# Patient Record
Sex: Male | Born: 1935 | Race: White | Hispanic: No | Smoking: Former smoker
Health system: Southern US, Community
[De-identification: ages and names within clinical notes are randomized; demographics above are authoritative.]

## PROBLEM LIST (undated history)

## (undated) DIAGNOSIS — J449 Chronic obstructive pulmonary disease, unspecified: Secondary | ICD-10-CM

## (undated) DIAGNOSIS — K219 Gastro-esophageal reflux disease without esophagitis: Secondary | ICD-10-CM

## (undated) DIAGNOSIS — Z87440 Personal history of urinary (tract) infections: Secondary | ICD-10-CM

## (undated) DIAGNOSIS — N189 Chronic kidney disease, unspecified: Secondary | ICD-10-CM

## (undated) DIAGNOSIS — R0602 Shortness of breath: Secondary | ICD-10-CM

## (undated) DIAGNOSIS — K589 Irritable bowel syndrome without diarrhea: Secondary | ICD-10-CM

## (undated) DIAGNOSIS — I10 Essential (primary) hypertension: Secondary | ICD-10-CM

## (undated) DIAGNOSIS — H3530 Unspecified macular degeneration: Secondary | ICD-10-CM

## (undated) DIAGNOSIS — D649 Anemia, unspecified: Secondary | ICD-10-CM

## (undated) DIAGNOSIS — G629 Polyneuropathy, unspecified: Secondary | ICD-10-CM

## (undated) DIAGNOSIS — K5792 Diverticulitis of intestine, part unspecified, without perforation or abscess without bleeding: Secondary | ICD-10-CM

## (undated) DIAGNOSIS — M1990 Unspecified osteoarthritis, unspecified site: Secondary | ICD-10-CM

## (undated) DIAGNOSIS — F32A Depression, unspecified: Secondary | ICD-10-CM

## (undated) DIAGNOSIS — F329 Major depressive disorder, single episode, unspecified: Secondary | ICD-10-CM

## (undated) DIAGNOSIS — N400 Enlarged prostate without lower urinary tract symptoms: Secondary | ICD-10-CM

## (undated) DIAGNOSIS — N289 Disorder of kidney and ureter, unspecified: Secondary | ICD-10-CM

## (undated) DIAGNOSIS — J189 Pneumonia, unspecified organism: Secondary | ICD-10-CM

## (undated) HISTORY — PX: HERNIA REPAIR: SHX51

## (undated) HISTORY — PX: EYE SURGERY: SHX253

## (undated) HISTORY — PX: COLONOSCOPY: SHX174

## (undated) HISTORY — PX: CHOLECYSTECTOMY: SHX55

---

## 2009-04-06 DIAGNOSIS — J189 Pneumonia, unspecified organism: Secondary | ICD-10-CM

## 2009-04-06 HISTORY — DX: Pneumonia, unspecified organism: J18.9

## 2013-06-05 ENCOUNTER — Ambulatory Visit (HOSPITAL_COMMUNITY)
Admission: RE | Admit: 2013-06-05 | Discharge: 2013-06-05 | Disposition: A | Discharge status: Home / Self Care | Payer: Medicare Other | Source: Ambulatory Visit | Attending: Anesthesiology | Admitting: Anesthesiology

## 2013-06-05 ENCOUNTER — Other Ambulatory Visit (HOSPITAL_COMMUNITY): Payer: Self-pay | Admitting: *Deleted

## 2013-06-05 ENCOUNTER — Encounter (HOSPITAL_COMMUNITY): Payer: Self-pay

## 2013-06-05 ENCOUNTER — Other Ambulatory Visit: Payer: Self-pay | Admitting: Orthopedic Surgery

## 2013-06-05 ENCOUNTER — Encounter (HOSPITAL_COMMUNITY)
Admission: RE | Admit: 2013-06-05 | Discharge: 2013-06-05 | Disposition: A | Discharge status: Home / Self Care | Payer: Medicare Other | Source: Ambulatory Visit | Attending: Orthopedic Surgery | Admitting: Orthopedic Surgery

## 2013-06-05 DIAGNOSIS — Z01812 Encounter for preprocedural laboratory examination: Secondary | ICD-10-CM

## 2013-06-05 DIAGNOSIS — Z01818 Encounter for other preprocedural examination: Secondary | ICD-10-CM | POA: Insufficient documentation

## 2013-06-05 HISTORY — DX: Unspecified osteoarthritis, unspecified site: M19.90

## 2013-06-05 HISTORY — DX: Polyneuropathy, unspecified: G62.9

## 2013-06-05 HISTORY — DX: Essential (primary) hypertension: I10

## 2013-06-05 HISTORY — DX: Irritable bowel syndrome, unspecified: K58.9

## 2013-06-05 HISTORY — DX: Depression, unspecified: F32.A

## 2013-06-05 HISTORY — DX: Unspecified macular degeneration: H35.30

## 2013-06-05 HISTORY — DX: Anemia, unspecified: D64.9

## 2013-06-05 HISTORY — DX: Shortness of breath: R06.02

## 2013-06-05 HISTORY — DX: Gastro-esophageal reflux disease without esophagitis: K21.9

## 2013-06-05 HISTORY — DX: Pneumonia, unspecified organism: J18.9

## 2013-06-05 HISTORY — DX: Personal history of urinary (tract) infections: Z87.440

## 2013-06-05 HISTORY — DX: Chronic obstructive pulmonary disease, unspecified: J44.9

## 2013-06-05 HISTORY — DX: Benign prostatic hyperplasia without lower urinary tract symptoms: N40.0

## 2013-06-05 HISTORY — DX: Diverticulitis of intestine, part unspecified, without perforation or abscess without bleeding: K57.92

## 2013-06-05 HISTORY — DX: Irritable bowel syndrome without diarrhea: K58.9

## 2013-06-05 HISTORY — DX: Major depressive disorder, single episode, unspecified: F32.9

## 2013-06-05 LAB — CBC
HCT: 33.2 % — ABNORMAL LOW (ref 39.0–52.0)
Hemoglobin: 11.4 g/dL — ABNORMAL LOW (ref 13.0–17.0)
MCH: 31.6 pg (ref 26.0–34.0)
MCHC: 34.3 g/dL (ref 30.0–36.0)
MCV: 92 fL (ref 78.0–100.0)
PLATELETS: 198 10*3/uL (ref 150–400)
RBC: 3.61 MIL/uL — ABNORMAL LOW (ref 4.22–5.81)
RDW: 13.2 % (ref 11.5–15.5)
WBC: 4.4 10*3/uL (ref 4.0–10.5)

## 2013-06-05 LAB — COMPREHENSIVE METABOLIC PANEL
ALK PHOS: 46 U/L (ref 39–117)
ALT: 12 U/L (ref 0–53)
AST: 18 U/L (ref 0–37)
Albumin: 3.2 g/dL — ABNORMAL LOW (ref 3.5–5.2)
BUN: 53 mg/dL — AB (ref 6–23)
CO2: 25 meq/L (ref 19–32)
Calcium: 8.8 mg/dL (ref 8.4–10.5)
Chloride: 100 mEq/L (ref 96–112)
Creatinine, Ser: 2.56 mg/dL — ABNORMAL HIGH (ref 0.50–1.35)
GFR, EST AFRICAN AMERICAN: 24 mL/min — AB (ref >90)
GFR, EST NON AFRICAN AMERICAN: 21 mL/min — AB (ref >90)
Glucose, Bld: 96 mg/dL (ref 70–99)
POTASSIUM: 4.5 meq/L (ref 3.7–5.3)
SODIUM: 137 meq/L (ref 137–147)
Total Bilirubin: 0.3 mg/dL (ref 0.3–1.2)
Total Protein: 6.8 g/dL (ref 6.0–8.3)

## 2013-06-05 MED ORDER — CEFAZOLIN SODIUM-DEXTROSE 2-3 GM-% IV SOLR
2.0000 g | INTRAVENOUS | Status: AC
  Administered 2013-06-06: 2 g via INTRAVENOUS
  Filled 2013-06-05: qty 50

## 2013-06-05 NOTE — Progress Notes (Signed)
Dr. Dulce SellarMunley in [place] [phone]) cardiologist. Requested EKG to be faxed.  Dr. Wyline MoodSteven Campbell in [place] - PCP  Dr. Saddie Bendershao - urologist

## 2013-06-05 NOTE — Progress Notes (Signed)
Anesthesia Chart Review:  Patient is a 80 year old male scheduled for ORIF left bimalleolar ankle fracture and ORIF of left ankle synomosis disruption on 06/06/13 by Dr. Victorino DikeHewitt. Patient fell on the ice last week. [name] was earlier this afternoon. I was asked to review chart after he left his [name] appointment.  Apparently, he was accompanied by his son who is a OR Engineer, civil (consulting)nurse in [place].    History includes HTN, former smoker, COPD, GERD, IBS, diverticulitis, BPH, macular degeneration, PNA '11, neuropathy, anemia, cholecystectomy.  PCP is Dr. Junious DresserStephen Campbell with [facility] [phone]).  BP was 94/52 at [name] today.  He saw cardiologist Dr. Dulce SellarMunley with [facility] [place] for reported chest pain in the fall of last year but did not require additional testing other than an EKG. I reviewed cardiology records this afternoon.  Last office note was from 02/27/14.  Notes indicate that he was seen for edema and dyspnea which apparently improved after stopping his calcium channel blocker.  Dr. Dulce SellarMunley did discuss at least consideration of having a stress and/or echo, but patient declined.  It appears these would be reconsidered if he had an recurrent symptoms; otherwise he was to follow-up with cardiology PRN. According to his [name] RN, patient denied any recent chest or new SOB.  Meds: MVI, Tylenol, Prilosec, Proscar, Inderal, Rapaflo, Flomax, Maxzide.  EKG on 01/24/13 [facility] showed SR with first degree AVB, occasional PACs, LAFB, low voltage with rightward P-axis and rotation, possibly pulmonary disease.  CXR on 06/05/13 showed no evidence of acute cardiopulmonary disease.  Preoperative labs showed a BUN/Cr on 53/2.56, K 4.5, H/H 11.4/33.2.  Glucose 96.  Comparison labs received from Dr. Orvan Falconerampbell from 01/20/13 and showed a BUN/Cr of 20/1.50 then.    Patient is 80 years old s/p ankle fractures last week with need for ORIF.  He denied any recent CV symptoms at his [name] visit.  He has not had any recent cardiac testing other than an EKG which showed no significant ST/T abnormalities.  BUN/Cr has increased over the past six months. He does have a history of BPH, and likely a degree of chronic renal insufficiency based on labs received from his PCP.  He will need close I/O perioperatively and close follow-up of renal function.  Hydration will hopefully help.  Of note, patient and son are interested in spinal anesthesia.  I reviewed with anesthesiologist Dr. Krista BlueSinger. Further evaluation by his assigned anesthesiologist on the day of surgery.  If no acute changes or new CV/CHF symptoms then it is anticipated that he can proceed with this procedure.    Velna Ochsllison Amarachukwu Lakatos, PA-C [facility] Short Stay Center/Anesthesiology Phone [phone] 06/05/2013 4:52 PM

## 2013-06-05 NOTE — Anesthesia Preprocedure Evaluation (Addendum)
Anesthesia Evaluation  Patient identified by MRN, date of birth, ID band Patient awake    Reviewed: Allergy & Precautions, H&P , NPO status , Patient's Chart, lab work & pertinent test results  Airway Mallampati: II TM Distance: >3 FB Neck ROM: Full    Dental  (+) Edentulous Upper, Partial Lower, Dental Advisory Given   Pulmonary shortness of breath, COPD oxygen dependent, former smoker,    Pulmonary exam normal       Cardiovascular hypertension, Pt. on medications and Pt. on home beta blockers     Neuro/Psych Depression negative neurological ROS     GI/Hepatic Neg liver ROS, GERD-  ,  Endo/Other    Renal/GU Renal disease     Musculoskeletal   Abdominal   Peds  Hematology   Anesthesia Other Findings   Reproductive/Obstetrics                         Anesthesia Physical Anesthesia Plan  ASA: III  Anesthesia Plan: MAC and Regional   Post-op Pain Management:    Induction:   Airway Management Planned: Simple Face Mask  Additional Equipment:   Intra-op Plan:   Post-operative Plan:   Informed Consent:   Dental advisory given  Plan Discussed with: CRNA, Anesthesiologist and Surgeon  Anesthesia Plan Comments: (See my anesthesia note.  Patient/son are interested in spinal anesthesia for this procedure.  Shonna ChockAllison Zelenak, PA-C)       Anesthesia Quick Evaluation

## 2013-06-05 NOTE — H&P (Signed)
Roy Barajas is an 80 y.o. male.   Chief Complaint: Left bimal fx with syndesmotic disruption  HPI:  80 y/o male reports to OR for surgical correction of his left ankle bimal fracture. Pt slipped on ice at his home on 05/28/2013 falling and causing the injury to the left ankle.  Pt c/o pain and swelling to the left ankle but denies N/V/F/C, chest pain, SOB, calf pain, or paresthesia b/l.  Past Medical History  Diagnosis Date  . Hypertension   . Depression   . COPD (chronic obstructive pulmonary disease)   . Pneumonia 2011  . Shortness of breath     with exertion  . GERD (gastroesophageal reflux disease)   . Neuropathy     hands and toes  . Arthritis     shoulders  . Anemia   . Enlarged prostate   . Irritable bowel syndrome (IBS)   . Diverticulitis   . Macular degeneration     left eye  . History of recurrent UTIs     Past Surgical History  Procedure Laterality Date  . Cholecystectomy    . Hernia repair      right inguinal  . Colonoscopy    . Eye surgery Bilateral     cataract surgery    No family history on file. Social History:  reports that he quit smoking about 65 years ago. He has never used smokeless tobacco. He reports that he drinks about 2.4 ounces of alcohol per week. He reports that he does not use illicit drugs.  Allergies: No Known Allergies  No prescriptions prior to admission    Results for orders placed during the hospital encounter of 06/05/13 (from the past 48 hour(s))  COMPREHENSIVE METABOLIC PANEL     Status: Abnormal   Collection Time    06/05/13 12:56 PM      Result Value Ref Range   Sodium 137  137 - 147 mEq/L   Potassium 4.5  3.7 - 5.3 mEq/L   Chloride 100  96 - 112 mEq/L   CO2 25  19 - 32 mEq/L   Glucose, Bld 96  70 - 99 mg/dL   BUN 53 (*) 6 - 23 mg/dL   Creatinine, Ser 2.56 (*) 0.50 - 1.35 mg/dL   Calcium 8.8  8.4 - 10.5 mg/dL   Total Protein 6.8  6.0 - 8.3 g/dL   Albumin 3.2 (*) 3.5 - 5.2 g/dL   AST 18  0 - 37 U/L   ALT 12  0 - 53 U/L    Alkaline Phosphatase 46  39 - 117 U/L   Total Bilirubin 0.3  0.3 - 1.2 mg/dL   GFR calc non Af Amer 21 (*) >90 mL/min   GFR calc Af Amer 24 (*) >90 mL/min   Comment: (NOTE)     The eGFR has been calculated using the CKD EPI equation.     This calculation has not been validated in all clinical situations.     eGFR's persistently <90 mL/min signify possible Chronic Kidney     Disease.  CBC     Status: Abnormal   Collection Time    06/05/13 12:56 PM      Result Value Ref Range   WBC 4.4  4.0 - 10.5 K/uL   RBC 3.61 (*) 4.22 - 5.81 MIL/uL   Hemoglobin 11.4 (*) 13.0 - 17.0 g/dL   HCT 33.2 (*) 39.0 - 52.0 %   MCV 92.0  78.0 - 100.0 fL   MCH 31.6  26.0 - 34.0 pg   MCHC 34.3  30.0 - 36.0 g/dL   RDW 13.2  11.5 - 15.5 %   Platelets 198  150 - 400 K/uL   Dg Chest 2 View  06/05/2013   CLINICAL DATA:  Pre admit for left foot/ankle fracture repair  EXAM: CHEST  2 VIEW  COMPARISON:  03/23/2010  FINDINGS: Lungs are clear.  No pleural effusion or pneumothorax.  The heart is normal in size.  Mild degenerative changes of the visualized thoracolumbar spine.  IMPRESSION: No evidence of acute cardiopulmonary disease.   Electronically Signed   By: Julian Hy M.D.   On: 06/05/2013 15:02    Review of Systems  Constitutional: Negative.   HENT: Negative.   Eyes: Negative.   Respiratory: Negative.   Cardiovascular: Negative.   Gastrointestinal: Negative.   Musculoskeletal: Negative.   Skin: Negative.   Neurological: Negative for tremors.  Endo/Heme/Allergies: Negative.   Psychiatric/Behavioral: The patient is not nervous/anxious.     There were no vitals taken for this visit. Physical Exam  80y/o elderly male in NAD, A/Ox3, appears stated age.  EOMI, mood and affect normal, respirations unlabored.   Left ankle: +edema circumferentially to left ankle with ecchymosis noted to medial and lateral submalleolar regions.  ROM to left ankle reduced, pain re-created with DF and PF of the ankle joint.  Skin healthy and intact.  DP pulses 2+b/l.  Normal sensation to light touch intact.  Distal toes well perfused cap refill <2sec.    Assessment/Plan Pt reports to OR for ORIF of left trimalleolar fracture and syndesmosis disruption.  FLOWERS, CHRISTOPHER S 06/05/2013, 5:49 PM  Pt seen and examined today.  Agree with note above.    A:  Right ankle bimal fracture and syndesmosis disruption. P:  To OR for ORIF of fractures and syndesmosis disruption.  The risks and benefits of the alternative treatment options have been discussed in detail.  The patient wishes to proceed with surgery and specifically understands risks of bleeding, infection, nerve damage, blood clots, need for additional surgery, amputation and death.

## 2013-06-05 NOTE — Pre-Procedure Instructions (Signed)
Roy SneddonLloyd Barajas  06/05/2013   Your procedure is scheduled on:  Tuesday, June 06, 2013 at 3:30 PM.   [facility] Entr[facility]A" Admitting Office at 1:30 PM.   Call this number if you have problems the morning of surgery: [phone]   Remember:   Do not eat food or drink liquids after midnight tonight.   Take these medicines the morning of surgery with A SIP OF WATER:  omeprazole (PRILOSEC), propranolol (INDERAL), tamsulosin (FLOMAX), finasteride (PROSCAR), acetaminophen (TYLENOL) - if needed.   Do not wear jewelry.  Do not wear lotions, powders, or cologne. You may wear deodorant.  Men may shave face and neck.  Do not bring valuables to the hospital.  [facility] is not responsible                  for any belongings or valuables.               Contacts, dentures or bridgework may not be worn into surgery.  Leave suitcase in the car. After surgery it may be brought to your room.  For patients admitted to the hospital, discharge time is determined by your                treatment team.                Special Instructions: [place] - Preparing for Surgery  Before surgery, you can play an important role.  Because skin is not sterile, your skin needs to be as free of germs as possible.  You can reduce the number of germs on you skin by washing with CHG (chlorahexidine gluconate) soap before surgery.  CHG is an antiseptic cleaner which kills germs and bonds with the skin to continue killing germs even after washing.  Please DO NOT use if you have an allergy to CHG or antibacterial soaps.  If your skin becomes reddened/irritated stop using the CHG and inform your nurse when you arrive at Short Stay.  Do not shave (including legs and underarms) for at least 48 hours prior to the first CHG shower.  You may shave your face.  Please follow these instructions carefully:   1.  Shower with CHG Soap the night before surgery and the                                morning of  Surgery.  2.  If you choose to wash your hair, wash your hair first as usual with your       normal shampoo.  3.  After you shampoo, rinse your hair and body thoroughly to remove the                      Shampoo.  4.  Use CHG as you would any other liquid soap.  You can apply chg directly       to the skin and wash gently with scrungie or a clean washcloth.  5.  Apply the CHG Soap to your body ONLY FROM THE NECK DOWN.        Do not use on open wounds or open sores.  Avoid contact with your eyes, ears, mouth and genitals (private parts).  Wash genitals (private parts) with your normal soap.  6.  Wash thoroughly, paying special attention to the area where your surgery        will be performed.  7.  Thoroughly rinse your body with warm water from the neck down.  8.  DO NOT shower/wash with your normal soap after using and rinsing off       the CHG Soap.  9.  [name] yourself dry with a clean towel.            10.  Wear clean pajamas.            11.  Place clean sheets on your bed the night of your first shower and do not        sleep with pets.  Day of Surgery  Do not apply any lotions the morning of surgery.  Please wear clean clothes to the hospital/surgery center.     Please read over the following fact sheets that you were given: Pain Booklet, Coughing and Deep Breathing and Surgical Site Infection Prevention

## 2013-06-05 NOTE — Progress Notes (Signed)
06/05/13 1257  OBSTRUCTIVE SLEEP APNEA  Have you ever been diagnosed with sleep apnea through a sleep study? No  Do you snore loudly (loud enough to be heard through closed doors)?  1  Do you often feel tired, fatigued, or sleepy during the daytime? 1  Has anyone observed you stop breathing during your sleep? 0  Do you have, or are you being treated for high blood pressure? 1  BMI more than 35 kg/m2? 0  Age over 78 years old? 1  Neck circumference greater than 40 cm/18 inches? 0  Gender: 1  Obstructive Sleep Apnea Score 5  Score 4 or greater  Results sent to PCP

## 2013-06-06 ENCOUNTER — Inpatient Hospital Stay (HOSPITAL_COMMUNITY)
Admission: RE | Admit: 2013-06-06 | Discharge: 2013-06-09 | DRG: 494 | Disposition: A | Discharge status: Skilled Nursing Facility | Payer: Medicare Other | Source: Ambulatory Visit | Attending: Orthopedic Surgery | Admitting: Orthopedic Surgery

## 2013-06-06 ENCOUNTER — Inpatient Hospital Stay (HOSPITAL_COMMUNITY): Payer: Medicare Other | Admitting: Anesthesiology

## 2013-06-06 ENCOUNTER — Encounter (HOSPITAL_COMMUNITY): Payer: Self-pay | Admitting: Surgery

## 2013-06-06 ENCOUNTER — Inpatient Hospital Stay (HOSPITAL_COMMUNITY): Payer: Medicare Other

## 2013-06-06 ENCOUNTER — Encounter (HOSPITAL_COMMUNITY): Payer: Medicare Other | Admitting: Vascular Surgery

## 2013-06-06 ENCOUNTER — Encounter (HOSPITAL_COMMUNITY)
Admission: RE | Disposition: A | Discharge status: Skilled Nursing Facility | Payer: Self-pay | Source: Ambulatory Visit | Attending: Orthopedic Surgery

## 2013-06-06 DIAGNOSIS — N400 Enlarged prostate without lower urinary tract symptoms: Secondary | ICD-10-CM | POA: Diagnosis present

## 2013-06-06 DIAGNOSIS — H3530 Unspecified macular degeneration: Secondary | ICD-10-CM | POA: Diagnosis present

## 2013-06-06 DIAGNOSIS — W010XXA Fall on same level from slipping, tripping and stumbling without subsequent striking against object, initial encounter: Secondary | ICD-10-CM | POA: Diagnosis present

## 2013-06-06 DIAGNOSIS — S93439A Sprain of tibiofibular ligament of unspecified ankle, initial encounter: Secondary | ICD-10-CM | POA: Diagnosis present

## 2013-06-06 DIAGNOSIS — J449 Chronic obstructive pulmonary disease, unspecified: Secondary | ICD-10-CM | POA: Diagnosis present

## 2013-06-06 DIAGNOSIS — S82842A Displaced bimalleolar fracture of left lower leg, initial encounter for closed fracture: Secondary | ICD-10-CM | POA: Diagnosis present

## 2013-06-06 DIAGNOSIS — J4489 Other specified chronic obstructive pulmonary disease: Secondary | ICD-10-CM | POA: Diagnosis present

## 2013-06-06 DIAGNOSIS — K219 Gastro-esophageal reflux disease without esophagitis: Secondary | ICD-10-CM | POA: Diagnosis present

## 2013-06-06 DIAGNOSIS — I10 Essential (primary) hypertension: Secondary | ICD-10-CM | POA: Diagnosis present

## 2013-06-06 DIAGNOSIS — S82843A Displaced bimalleolar fracture of unspecified lower leg, initial encounter for closed fracture: Principal | ICD-10-CM | POA: Diagnosis present

## 2013-06-06 HISTORY — PX: ORIF ANKLE FRACTURE: SHX5408

## 2013-06-06 HISTORY — DX: Disorder of kidney and ureter, unspecified: N28.9

## 2013-06-06 HISTORY — DX: Chronic kidney disease, unspecified: N18.9

## 2013-06-06 LAB — CBC
HCT: 33.4 % — ABNORMAL LOW (ref 39.0–52.0)
HEMOGLOBIN: 11.6 g/dL — AB (ref 13.0–17.0)
MCH: 32 pg (ref 26.0–34.0)
MCHC: 34.7 g/dL (ref 30.0–36.0)
MCV: 92 fL (ref 78.0–100.0)
Platelets: 179 10*3/uL (ref 150–400)
RBC: 3.63 MIL/uL — ABNORMAL LOW (ref 4.22–5.81)
RDW: 13 % (ref 11.5–15.5)
WBC: 2 10*3/uL — AB (ref 4.0–10.5)

## 2013-06-06 LAB — CREATININE, SERUM
Creatinine, Ser: 2.1 mg/dL — ABNORMAL HIGH (ref 0.50–1.35)
GFR, EST AFRICAN AMERICAN: 30 mL/min — AB (ref >90)
GFR, EST NON AFRICAN AMERICAN: 26 mL/min — AB (ref >90)

## 2013-06-06 SURGERY — OPEN REDUCTION INTERNAL FIXATION (ORIF) ANKLE FRACTURE
Anesthesia: Monitor Anesthesia Care | Site: Ankle | Laterality: Left

## 2013-06-06 MED ORDER — TRIAMTERENE-HCTZ 37.5-25 MG PO TABS
1.0000 | ORAL_TABLET | Freq: Every day | ORAL | Status: DC
  Administered 2013-06-07 – 2013-06-09 (×3): 1 via ORAL
  Filled 2013-06-06 (×3): qty 1

## 2013-06-06 MED ORDER — SENNA 8.6 MG PO TABS
2.0000 | ORAL_TABLET | Freq: Two times a day (BID) | ORAL | Status: DC
  Administered 2013-06-06 – 2013-06-08 (×4): 17.2 mg via ORAL
  Filled 2013-06-06 (×8): qty 2

## 2013-06-06 MED ORDER — PROPRANOLOL HCL 20 MG PO TABS: 20.0000 mg | ORAL_TABLET | ORAL | Status: DC

## 2013-06-06 MED ORDER — PHENYLEPHRINE 40 MCG/ML (10ML) SYRINGE FOR IV PUSH (FOR BLOOD PRESSURE SUPPORT)
PREFILLED_SYRINGE | INTRAVENOUS | Status: AC
  Filled 2013-06-06: qty 10

## 2013-06-06 MED ORDER — EPHEDRINE SULFATE 50 MG/ML IJ SOLN
INTRAMUSCULAR | Status: AC
  Filled 2013-06-06: qty 1

## 2013-06-06 MED ORDER — PHENYLEPHRINE HCL 10 MG/ML IJ SOLN
INTRAMUSCULAR | Status: AC
  Filled 2013-06-06: qty 2

## 2013-06-06 MED ORDER — PROPOFOL 10 MG/ML IV BOLUS
INTRAVENOUS | Status: AC
  Filled 2013-06-06: qty 20

## 2013-06-06 MED ORDER — PROPOFOL INFUSION 10 MG/ML OPTIME
INTRAVENOUS | Status: DC | PRN
  Administered 2013-06-06: 50 ug/kg/min via INTRAVENOUS

## 2013-06-06 MED ORDER — PROPRANOLOL HCL 20 MG PO TABS
20.0000 mg | ORAL_TABLET | Freq: Every day | ORAL | Status: DC
  Administered 2013-06-07 – 2013-06-09 (×3): 20 mg via ORAL
  Filled 2013-06-06 (×3): qty 1

## 2013-06-06 MED ORDER — NON FORMULARY
20.0000 mg | Freq: Two times a day (BID) | Status: DC
Start: 2013-06-06 — End: 2013-06-06

## 2013-06-06 MED ORDER — OXYCODONE HCL 5 MG PO TABS: 5.0000 mg | ORAL_TABLET | Freq: Once | ORAL | Status: DC | PRN

## 2013-06-06 MED ORDER — CHLORHEXIDINE GLUCONATE 4 % EX LIQD
60.0000 mL | Freq: Once | CUTANEOUS | Status: DC
  Filled 2013-06-06: qty 60

## 2013-06-06 MED ORDER — FENTANYL CITRATE 0.05 MG/ML IJ SOLN
INTRAMUSCULAR | Status: AC
  Administered 2013-06-06: 50 ug
  Filled 2013-06-06: qty 2

## 2013-06-06 MED ORDER — ADULT MULTIVITAMIN W/MINERALS CH
1.0000 | ORAL_TABLET | Freq: Every day | ORAL | Status: DC
  Administered 2013-06-07 – 2013-06-09 (×3): 1 via ORAL
  Filled 2013-06-06 (×3): qty 1

## 2013-06-06 MED ORDER — DEXAMETHASONE SODIUM PHOSPHATE 10 MG/ML IJ SOLN
INTRAMUSCULAR | Status: DC | PRN
  Administered 2013-06-06: 8 mg

## 2013-06-06 MED ORDER — MULTI-VITAMIN/MINERALS PO TABS: 1.0000 | ORAL_TABLET | Freq: Every day | ORAL | Status: DC

## 2013-06-06 MED ORDER — FENTANYL CITRATE 0.05 MG/ML IJ SOLN
INTRAMUSCULAR | Status: DC | PRN
  Administered 2013-06-06: 25 ug via INTRAVENOUS

## 2013-06-06 MED ORDER — OXYCODONE HCL 5 MG PO TABS
5.0000 mg | ORAL_TABLET | ORAL | Status: DC | PRN
  Administered 2013-06-07 (×3): 5 mg via ORAL
  Filled 2013-06-06 (×3): qty 1

## 2013-06-06 MED ORDER — ONDANSETRON HCL 4 MG/2ML IJ SOLN: 4.0000 mg | Freq: Four times a day (QID) | INTRAMUSCULAR | Status: DC | PRN

## 2013-06-06 MED ORDER — NON FORMULARY: 8.0000 mg | Freq: Every day | Status: DC

## 2013-06-06 MED ORDER — SUCCINYLCHOLINE CHLORIDE 20 MG/ML IJ SOLN
INTRAMUSCULAR | Status: AC
  Filled 2013-06-06: qty 1

## 2013-06-06 MED ORDER — ACETAMINOPHEN 500 MG PO TABS
1000.0000 mg | ORAL_TABLET | Freq: Once | ORAL | Status: AC
  Administered 2013-06-06: 1000 mg via ORAL
  Filled 2013-06-06: qty 2

## 2013-06-06 MED ORDER — SODIUM CHLORIDE 0.9 % IV SOLN
INTRAVENOUS | Status: DC
  Administered 2013-06-06 – 2013-06-07 (×2): via INTRAVENOUS

## 2013-06-06 MED ORDER — ONDANSETRON HCL 4 MG PO TABS
4.0000 mg | ORAL_TABLET | Freq: Four times a day (QID) | ORAL | Status: DC | PRN
  Administered 2013-06-08: 4 mg via ORAL
  Filled 2013-06-06: qty 1

## 2013-06-06 MED ORDER — OXYCODONE HCL 5 MG/5ML PO SOLN: 5.0000 mg | Freq: Once | ORAL | Status: DC | PRN

## 2013-06-06 MED ORDER — LACTATED RINGERS IV SOLN
INTRAVENOUS | Status: DC | PRN
  Administered 2013-06-06: 15:00:00 via INTRAVENOUS

## 2013-06-06 MED ORDER — ALBUTEROL SULFATE (2.5 MG/3ML) 0.083% IN NEBU
INHALATION_SOLUTION | RESPIRATORY_TRACT | Status: AC
Start: 2013-06-06 — End: 2013-06-07
  Filled 2013-06-06: qty 3

## 2013-06-06 MED ORDER — PANTOPRAZOLE SODIUM 40 MG PO TBEC: 40.0000 mg | DELAYED_RELEASE_TABLET | Freq: Every day | ORAL | Status: DC

## 2013-06-06 MED ORDER — HYDROMORPHONE HCL PF 1 MG/ML IJ SOLN
0.5000 mg | INTRAMUSCULAR | Status: DC | PRN
  Administered 2013-06-07: 0.5 mg via INTRAVENOUS
  Filled 2013-06-06: qty 1

## 2013-06-06 MED ORDER — FINASTERIDE 5 MG PO TABS
5.0000 mg | ORAL_TABLET | Freq: Every day | ORAL | Status: DC
  Administered 2013-06-07 – 2013-06-09 (×3): 5 mg via ORAL
  Filled 2013-06-06 (×4): qty 1

## 2013-06-06 MED ORDER — OMEPRAZOLE 20 MG PO CPDR
20.0000 mg | DELAYED_RELEASE_CAPSULE | Freq: Two times a day (BID) | ORAL | Status: DC
  Administered 2013-06-06 – 2013-06-09 (×6): 20 mg via ORAL
  Filled 2013-06-06 (×7): qty 1

## 2013-06-06 MED ORDER — ROPIVACAINE HCL 5 MG/ML IJ SOLN
INTRAMUSCULAR | Status: DC | PRN
  Administered 2013-06-06: 150 mg via PERINEURAL

## 2013-06-06 MED ORDER — PROMETHAZINE HCL 25 MG/ML IJ SOLN: 6.2500 mg | INTRAMUSCULAR | Status: DC | PRN

## 2013-06-06 MED ORDER — SILODOSIN 8 MG PO CAPS
8.0000 mg | ORAL_CAPSULE | Freq: Every day | ORAL | Status: DC
  Administered 2013-06-07 – 2013-06-09 (×3): 8 mg via ORAL
  Filled 2013-06-06 (×4): qty 1

## 2013-06-06 MED ORDER — DOCUSATE SODIUM 100 MG PO CAPS
100.0000 mg | ORAL_CAPSULE | Freq: Two times a day (BID) | ORAL | Status: DC
  Administered 2013-06-06 – 2013-06-08 (×4): 100 mg via ORAL
  Filled 2013-06-06 (×7): qty 1

## 2013-06-06 MED ORDER — FENTANYL CITRATE 0.05 MG/ML IJ SOLN
INTRAMUSCULAR | Status: AC
  Filled 2013-06-06: qty 5

## 2013-06-06 MED ORDER — 0.9 % SODIUM CHLORIDE (POUR BTL) OPTIME
TOPICAL | Status: DC | PRN
  Administered 2013-06-06: 1000 mL

## 2013-06-06 MED ORDER — SODIUM CHLORIDE 0.9 % IV SOLN
INTRAVENOUS | Status: DC
  Administered 2013-06-06: 100 mL via INTRAVENOUS

## 2013-06-06 MED ORDER — ENOXAPARIN SODIUM 40 MG/0.4ML ~~LOC~~ SOLN
40.0000 mg | SUBCUTANEOUS | Status: DC
  Administered 2013-06-07: 40 mg via SUBCUTANEOUS
  Filled 2013-06-06: qty 0.4

## 2013-06-06 MED ORDER — PHENYLEPHRINE HCL 10 MG/ML IJ SOLN
INTRAMUSCULAR | Status: DC | PRN
  Administered 2013-06-06: 40 ug via INTRAVENOUS

## 2013-06-06 MED ORDER — HYDROMORPHONE HCL PF 1 MG/ML IJ SOLN: 0.2500 mg | INTRAMUSCULAR | Status: DC | PRN

## 2013-06-06 MED ORDER — TAMSULOSIN HCL 0.4 MG PO CAPS
0.4000 mg | ORAL_CAPSULE | Freq: Two times a day (BID) | ORAL | Status: DC
  Administered 2013-06-06 – 2013-06-09 (×6): 0.4 mg via ORAL
  Filled 2013-06-06 (×8): qty 1

## 2013-06-06 MED ORDER — ALBUTEROL SULFATE (2.5 MG/3ML) 0.083% IN NEBU
2.5000 mg | INHALATION_SOLUTION | Freq: Once | RESPIRATORY_TRACT | Status: AC
  Administered 2013-06-06: 2.5 mg via RESPIRATORY_TRACT

## 2013-06-06 MED ORDER — ACETAMINOPHEN 325 MG PO TABS
650.0000 mg | ORAL_TABLET | Freq: Four times a day (QID) | ORAL | Status: DC | PRN
  Administered 2013-06-09: 650 mg via ORAL
  Filled 2013-06-06: qty 2

## 2013-06-06 SURGICAL SUPPLY — 68 items
BANDAGE ESMARK 6X9 LF (GAUZE/BANDAGES/DRESSINGS) ×1 IMPLANT
BIT DRILL 2.5X2.75 QC CALB (BIT) ×3 IMPLANT
BIT DRILL 3.5X5.5 QC CALB (BIT) ×3 IMPLANT
BIT DRILL CALIBRATED 2.7 (BIT) ×2 IMPLANT
BIT DRILL CALIBRATED 2.7MM (BIT) ×1
BLADE SURG 15 STRL LF DISP TIS (BLADE) ×1 IMPLANT
BLADE SURG 15 STRL SS (BLADE) ×2
BNDG COHESIVE 4X5 TAN STRL (GAUZE/BANDAGES/DRESSINGS) ×3 IMPLANT
BNDG COHESIVE 6X5 TAN STRL LF (GAUZE/BANDAGES/DRESSINGS) ×3 IMPLANT
BNDG ESMARK 6X9 LF (GAUZE/BANDAGES/DRESSINGS) ×3
CANISTER SUCT 3000ML (MISCELLANEOUS) ×3 IMPLANT
CHLORAPREP W/TINT 26ML (MISCELLANEOUS) ×3 IMPLANT
COVER SURGICAL LIGHT HANDLE (MISCELLANEOUS) ×3 IMPLANT
CUFF TOURNIQUET SINGLE 34IN LL (TOURNIQUET CUFF) ×3 IMPLANT
CUFF TOURNIQUET SINGLE 44IN (TOURNIQUET CUFF) IMPLANT
DRAPE C-ARM 42X72 X-RAY (DRAPES) ×3 IMPLANT
DRAPE C-ARMOR (DRAPES) ×3 IMPLANT
DRAPE U-SHAPE 47X51 STRL (DRAPES) ×3 IMPLANT
DRSG ADAPTIC 3X8 NADH LF (GAUZE/BANDAGES/DRESSINGS) ×3 IMPLANT
DRSG PAD ABDOMINAL 8X10 ST (GAUZE/BANDAGES/DRESSINGS) ×3 IMPLANT
ELECT REM PT RETURN 9FT ADLT (ELECTROSURGICAL) ×3
ELECTRODE REM PT RTRN 9FT ADLT (ELECTROSURGICAL) ×1 IMPLANT
GLOVE BIO SURGEON STRL SZ7 (GLOVE) ×3 IMPLANT
GLOVE BIO SURGEON STRL SZ8 (GLOVE) ×3 IMPLANT
GLOVE BIOGEL PI IND STRL 7.5 (GLOVE) ×1 IMPLANT
GLOVE BIOGEL PI IND STRL 8 (GLOVE) ×1 IMPLANT
GLOVE BIOGEL PI INDICATOR 7.5 (GLOVE) ×2
GLOVE BIOGEL PI INDICATOR 8 (GLOVE) ×2
GOWN STRL REUS W/ TWL LRG LVL3 (GOWN DISPOSABLE) ×2 IMPLANT
GOWN STRL REUS W/ TWL XL LVL3 (GOWN DISPOSABLE) ×1 IMPLANT
GOWN STRL REUS W/TWL LRG LVL3 (GOWN DISPOSABLE) ×4
GOWN STRL REUS W/TWL XL LVL3 (GOWN DISPOSABLE) ×2
KIT BASIN OR (CUSTOM PROCEDURE TRAY) ×3 IMPLANT
KIT ROOM TURNOVER OR (KITS) ×3 IMPLANT
NEEDLE 22X1 1/2 (OR ONLY) (NEEDLE) IMPLANT
NS IRRIG 1000ML POUR BTL (IV SOLUTION) ×3 IMPLANT
PACK ORTHO EXTREMITY (CUSTOM PROCEDURE TRAY) ×3 IMPLANT
PAD ARMBOARD 7.5X6 YLW CONV (MISCELLANEOUS) ×6 IMPLANT
PAD CAST 4YDX4 CTTN HI CHSV (CAST SUPPLIES) ×1 IMPLANT
PADDING CAST COTTON 4X4 STRL (CAST SUPPLIES) ×2
PADDING CAST COTTON 6X4 STRL (CAST SUPPLIES) ×3 IMPLANT
PLATE LOCK 8H 103 BILAT FIB (Plate) ×3 IMPLANT
SCREW ACE CAN 4.0 44M (Screw) ×6 IMPLANT
SCREW LOCK CORT STAR 3.5X14 (Screw) ×3 IMPLANT
SCREW LOW PROFILE 18MMX3.5MM (Screw) ×9 IMPLANT
SCREW LP 3.5X60MM (Screw) ×3 IMPLANT
SCREW LP 3.5X65MM (Screw) ×3 IMPLANT
SCREW NON LOCKING LP 3.5 16MM (Screw) ×3 IMPLANT
SPLINT PLASTER CAST XFAST 5X30 (CAST SUPPLIES) ×1 IMPLANT
SPLINT PLASTER XFAST SET 5X30 (CAST SUPPLIES) ×2
SPONGE GAUZE 4X4 12PLY (GAUZE/BANDAGES/DRESSINGS) ×3 IMPLANT
SPONGE LAP 18X18 X RAY DECT (DISPOSABLE) ×3 IMPLANT
STAPLER VISISTAT 35W (STAPLE) IMPLANT
SUCTION FRAZIER TIP 10 FR DISP (SUCTIONS) ×3 IMPLANT
SUT ETHILON 3 0 PS 1 (SUTURE) ×6 IMPLANT
SUT MNCRL AB 3-0 PS2 18 (SUTURE) ×6 IMPLANT
SUT PROLENE 3 0 PS 2 (SUTURE) ×3 IMPLANT
SUT VIC AB 2-0 CT1 27 (SUTURE) ×4
SUT VIC AB 2-0 CT1 TAPERPNT 27 (SUTURE) ×2 IMPLANT
SUT VIC AB 3-0 PS2 18 (SUTURE)
SUT VIC AB 3-0 PS2 18XBRD (SUTURE) IMPLANT
SYR CONTROL 10ML LL (SYRINGE) IMPLANT
TOWEL OR 17X24 6PK STRL BLUE (TOWEL DISPOSABLE) ×3 IMPLANT
TOWEL OR 17X26 10 PK STRL BLUE (TOWEL DISPOSABLE) ×3 IMPLANT
TUBE CONNECTING 12'X1/4 (SUCTIONS) ×1
TUBE CONNECTING 12X1/4 (SUCTIONS) ×2 IMPLANT
WATER STERILE IRR 1000ML POUR (IV SOLUTION) ×3 IMPLANT
WIRE K 1.6MM 144256 (MISCELLANEOUS) ×9 IMPLANT

## 2013-06-06 NOTE — Anesthesia Procedure Notes (Signed)
Anesthesia Regional Block:  Popliteal block  Pre-Anesthetic Checklist: ,, timeout performed, Correct Patient, Correct Site, Correct Laterality, Correct Procedure, Correct Position, site marked, Risks and benefits discussed,  Surgical consent,  Pre-op evaluation,  At surgeon's request and post-op pain management  Laterality: Left  Prep: chloraprep       Needles:  Injection technique: Single-shot  Needle Type: Echogenic Stimulator Needle          Additional Needles:  Procedures: ultrasound guided (picture in chart) and nerve stimulator Popliteal block  Nerve Stimulator or Paresthesia:  Response: plantar flexion, 0.45 mA,   Additional Responses:   Narrative:  Start time: 06/06/2013 2:45 PM End time: 06/06/2013 2:58 PM  Performed by: Personally  Anesthesiologist: J. Adonis Hugueninan Jen Eppinger, MD  Additional Notes: A functioning IV was confirmed and monitors were applied.  Sterile prep and drape, hand hygiene and sterile gloves were used.  Negative aspiration and test dose prior to incremental administration of local anesthetic. The patient tolerated the procedure well.Ultrasound  guidance: relevant anatomy identified, needle position confirmed, local anesthetic spread visualized around nerve(s), vascular puncture avoided.  Image printed for medical record.

## 2013-06-06 NOTE — Discharge Instructions (Signed)
Roy Pettaway, MD °[facility] ° °Please read the following information regarding your care after surgery. ° °Medications  °You only need a prescription for the narcotic pain medicine (ex. oxycodone, Percocet, Norco).  All of the other medicines listed below are available over the counter. °X acetominophen (Tylenol) 650 mg every 4-6 hours as you need for minor pain °X oxycodone as prescribed for moderate to severe pain °?  ° °Narcotic pain medicine (ex. oxycodone, Percocet, Vicodin) will cause constipation.  To prevent this problem, take the following medicines while you are taking any pain medicine. °X docusate sodium (Colace) 100 mg twice a day X senna (Senokot) 2 tablets twice a day ° °X To help prevent blood clots, take an aspirin (325 mg) once a day for a month after surgery.  You should also get up every hour while you are awake to move around.   ° °Weight Bearing °? Bear weight when you are able on your operated leg or foot. °? Bear weight only on the heel of your operated foot in the post-op shoe. °X Do not bear any weight on the operated leg or foot. ° °Cast / Splint / Dressing °X Keep your splint or cast clean and dry.  Don’t put anything (coat hanger, pencil, etc) down inside of it.  If it gets damp, use a hair dryer on the cool setting to dry it.  If it gets soaked, call the office to schedule an appointment for a cast change. °? Remove your dressing 3 days after surgery and cover the incisions with dry dressings.   ° °After your dressing, cast or splint is removed; you may shower, but do not soak or scrub the wound.  Allow the water to run over it, and then gently pat it dry. ° °Swelling °It is normal for you to have swelling where you had surgery.  To reduce swelling and pain, keep your toes above your nose for at least 3 days after surgery.  It may be necessary to keep your foot or leg elevated for several weeks.  If it hurts, it should be elevated. ° °Follow Up °Call my office at  [phone] when you are discharged from the hospital or surgery center to schedule an appointment to be seen two weeks after surgery. ° °Call my office at [phone] if you develop a fever >101.5° F, nausea, vomiting, bleeding from the surgical site or severe pain.   ° ° °

## 2013-06-06 NOTE — Brief Op Note (Signed)
06/06/2013  5:36 PM  PATIENT:  Roy SneddonLloyd Barajas  80 y.o. male  PRE-OPERATIVE DIAGNOSIS:  Left bimalleolar ankle fracture with syndesmosis disruption  POST-OPERATIVE DIAGNOSIS:  Left bimalleolar ankle fracture with syndesmosis disruption  Procedure(s): 1.  ORIF left ankle bimalleolar fracture 2.  ORIF left ankle syndesmosis  SURGEON:  Toni ArthursJohn Laquan Ludden, MD  ASSISTANT: n/a  ANESTHESIA:  MAC, regional  EBL:  minimal   TOURNIQUET:   Total Tourniquet Time Documented: Thigh (Left) - 48 minutes Total: Thigh (Left) - 48 minutes   COMPLICATIONS:  None apparent  DISPOSITION:  Extubated, awake and stable to recovery.  DICTATION ID:  [id number]

## 2013-06-06 NOTE — Preoperative (Signed)
Beta Blockers   Reason not to administer Beta Blockers:Not Applicable 

## 2013-06-06 NOTE — Anesthesia Postprocedure Evaluation (Signed)
  Anesthesia Post-op Note  Patient: Roy SneddonLloyd Barajas  Procedure(s) Performed: Procedure(s): OPEN REDUCTION INTERNAL FIXATION (ORIF) LEFT BIMALLEOLAR ANKLE FRACTURE AND LEFT ANKLE SYNDESMOSIS DISRUPTION (Left)  Patient Location: PACU  Anesthesia Type:General  Level of Consciousness: awake, alert , oriented and patient cooperative  Airway and Oxygen Therapy: Patient Spontanous Breathing and Patient connected to nasal cannula oxygen  Post-op Pain: none  Post-op Assessment: Post-op Vital signs reviewed, Patient's Cardiovascular Status Stable, Respiratory Function Stable, Patent Airway, No signs of Nausea or vomiting and Pain level controlled  Post-op Vital Signs: Reviewed and stable  Complications: No apparent anesthesia complications

## 2013-06-06 NOTE — Transfer of Care (Signed)
Immediate Anesthesia Transfer of Care Note  Patient: Roy Barajas  Procedure(s) Performed: Procedure(s): OPEN REDUCTION INTERNAL FIXATION (ORIF) LEFT BIMALLEOLAR ANKLE FRACTURE AND LEFT ANKLE SYNDESMOSIS DISRUPTION (Left)  Patient Location: PACU  Anesthesia Type:MAC combined with regional for post-op pain  Level of Consciousness: awake and alert   Airway & Oxygen Therapy: Patient Spontanous Breathing and Patient connected to nasal cannula oxygen  Post-op Assessment: Report given to PACU RN and Post -op Vital signs reviewed and stable  Post vital signs: Reviewed and stable  Complications: No apparent anesthesia complications

## 2013-06-07 MED ORDER — ENOXAPARIN SODIUM 30 MG/0.3ML ~~LOC~~ SOLN
30.0000 mg | SUBCUTANEOUS | Status: DC
  Administered 2013-06-08 – 2013-06-09 (×2): 30 mg via SUBCUTANEOUS
  Filled 2013-06-07 (×2): qty 0.3

## 2013-06-07 MED ORDER — POLYVINYL ALCOHOL 1.4 % OP SOLN
1.0000 [drp] | OPHTHALMIC | Status: DC | PRN
  Administered 2013-06-07: 1 [drp] via OPHTHALMIC
  Filled 2013-06-07: qty 15

## 2013-06-07 MED ORDER — OXYCODONE HCL 5 MG PO TABS
5.0000 mg | ORAL_TABLET | ORAL | Status: DC | PRN
  Administered 2013-06-07 (×2): 10 mg via ORAL
  Administered 2013-06-08: 5 mg via ORAL
  Administered 2013-06-08: 10 mg via ORAL
  Filled 2013-06-07 (×4): qty 2

## 2013-06-07 MED ORDER — ENSURE COMPLETE PO LIQD
237.0000 mL | Freq: Two times a day (BID) | ORAL | Status: DC
  Administered 2013-06-07 – 2013-06-09 (×5): 237 mL via ORAL

## 2013-06-07 NOTE — Progress Notes (Signed)
Subjective: 1 Day Post-Op Procedure(s) (LRB): OPEN REDUCTION INTERNAL FIXATION (ORIF) LEFT BIMALLEOLAR ANKLE FRACTURE AND LEFT ANKLE SYNDESMOSIS DISRUPTION (Left) Patient doing well this AM. C/O mild SOB but pain well controlled, he is able to eat and denies N/V/F/C, chest pain, calf pain or paresthesia b/l.    Objective: Vital signs in last 24 hours: Temp:  [97.3 F (36.3 C)-98.4 F (36.9 C)] 97.9 F (36.6 C) (03/04 0617) Pulse Rate:  [45-79] 65 (03/04 0617) Resp:  [10-18] 16 (03/04 0617) BP: (122-138)/(52-81) 138/61 mmHg (03/04 0617) SpO2:  [96 %-100 %] 100 % (03/04 0617)  Intake/Output from previous day: 03/03 0701 - 03/04 0700 In: 1881.7 [P.O.:600; I.V.:1281.7] Out: 750 [Urine:750] Intake/Output this shift:     Recent Labs  06/05/13 1256 06/06/13 2052  HGB 11.4* 11.6*    Recent Labs  06/05/13 1256 06/06/13 2052  WBC 4.4 2.0*  RBC 3.61* 3.63*  HCT 33.2* 33.4*  PLT 198 179    Recent Labs  06/05/13 1256 06/06/13 2052  NA 137  --   K 4.5  --   CL 100  --   CO2 25  --   BUN 53*  --   CREATININE 2.56* 2.10*  GLUCOSE 96  --   CALCIUM 8.8  --    No results found for this basename: LABPT, INR,  in the last 72 hours  WD WN elderly 80 y/o male in NAD, A/Ox3, appears stated age. EOMI, mood and affect normal, respirations unlabored on 2L [place].  On physical exam splint c/d/i, in proper placement and good repair.  Toes non-mobile but well perfused with good sensation to light touch.    Assessment/Plan: 1 Day Post-Op Procedure(s) (LRB): OPEN REDUCTION INTERNAL FIXATION (ORIF) LEFT BIMALLEOLAR ANKLE FRACTURE AND LEFT ANKLE SYNDESMOSIS DISRUPTION (Left) Begin PT/OT for rehab. Continue Lovenox for anticoagulation. Nerve block still having affect on motor of distal toes, will continue to monitor. Plan for SNF placement 06/09/2013.  Talen Poser S 06/07/2013, 8:03 AM

## 2013-06-07 NOTE — Progress Notes (Signed)
INITIAL NUTRITION ASSESSMENT  DOCUMENTATION CODES Per approved criteria  -Not Applicable   INTERVENTION: Ensure Complete po BID, each supplement provides 350 kcal and 13 grams of protein  NUTRITION DIAGNOSIS: Unintentional weight loss related to decreased appetite as evidenced by per pt report of 3% weight loss x 3-4 months.   Goal: Pt to meet >/= 90% of their estimated nutrition needs   Monitor:  PO intake, supplement acceptance  Reason for Assessment: Pt identified as at nutrition risk on the Malnutrition Screen Tool  80 y.o. male  Admitting Dx: <principal problem not specified>  ASSESSMENT: Pt admitted for ankle fx, s/p repair. Per pt and son Banker) pt has had a decreased appetite for the last few months and feels he is eating less. Pt reports approx 5 lb weight loss in the last 3-4 months. Pt has been drinking ensure at home. Pt to be d/c'ed to SNF for rehab. Nutrition-focused physical exam WNL.  Pt currently consuming 100% of his meals, pt would like ensure.  Height: Ht Readings from Last 1 Encounters:  06/05/13 5\' 9"  (1.753 m)    Weight: Wt Readings from Last 1 Encounters:  06/05/13 180 lb (81.647 kg)    Ideal Body Weight: 72.7 kg  % Ideal Body Weight: 113%  Wt Readings from Last 10 Encounters:  06/05/13 180 lb (81.647 kg)    Usual Body Weight: 185 lb  % Usual Body Weight: 97%  BMI:  There is no weight on file to calculate BMI.  Estimated Nutritional Needs: Kcal: 1800-2000 Protein: 75-85 grams Fluid: > 1.8 L/day  Skin: ankle incision  Diet Order: General Meal Completion: 100%  EDUCATION NEEDS: -No education needs identified at this time   Intake/Output Summary (Last 24 hours) at 06/07/13 1241 Last data filed at 06/07/13 0900  Gross per 24 hour  Intake 2121.67 ml  Output    750 ml  Net 1371.67 ml    Last BM: 3/4   Labs:   Recent Labs Lab 06/05/13 1256 06/06/13 2052  NA 137  --   K 4.5  --   CL 100  --   CO2 25  --   BUN 53*   --   CREATININE 2.56* 2.10*  CALCIUM 8.8  --   GLUCOSE 96  --     CBG (last 3)  No results found for this basename: GLUCAP,  in the last 72 hours  Scheduled Meds: . docusate sodium  100 mg Oral BID  . enoxaparin (LOVENOX) injection  40 mg Subcutaneous Q24H  . finasteride  5 mg Oral Daily  . multivitamin with minerals  1 tablet Oral Daily  . omeprazole  20 mg Oral BID  . propranolol  20 mg Oral Daily  . senna  2 tablet Oral BID  . silodosin  8 mg Oral Q breakfast  . tamsulosin  0.4 mg Oral BID  . triamterene-hydrochlorothiazide  1 tablet Oral Daily    Continuous Infusions: . sodium chloride 100 mL/hr at 06/07/13 0448    Past Medical History  Diagnosis Date  . Hypertension   . Depression   . COPD (chronic obstructive pulmonary disease)   . Pneumonia 2011  . Shortness of breath     with exertion  . GERD (gastroesophageal reflux disease)   . Neuropathy     hands and toes  . Arthritis     shoulders  . Anemia   . Enlarged prostate   . Irritable bowel syndrome (IBS)   . Diverticulitis   . Macular  degeneration     left eye  . History of recurrent UTIs   . Chronic renal insufficiency     Past Surgical History  Procedure Laterality Date  . Cholecystectomy    . Hernia repair      right inguinal  . Colonoscopy    . Eye surgery Bilateral     cataract surgery    Kendell BaneHeather Kamorah Nevils RD, LDN, CNSC [phone] Pager [phone] After Hours Pager

## 2013-06-07 NOTE — Op Note (Signed)
NAMLetha Cape:  Violette, [place]                  ACCOUNT NO.:  [id number]  MEDICAL RECORD NO.:  [id number]  LOCATION:  [id number]                        FACILITY:  [facility]  PHYSICIAN:  Toni ArthursJohn Neyra Pettie, MD        DATE OF BIRTH:  1920/09/21  DATE OF PROCEDURE:  06/06/2013 DATE OF DISCHARGE:                              OPERATIVE REPORT   PREOPERATIVE DIAGNOSIS:  Left bimalleolar ankle fracture and syndesmosis disruption.  POSTOPERATIVE DIAGNOSIS:  Left bimalleolar ankle fracture and syndesmosis disruption.  PROCEDURE: 1. Open reduction and internal fixation of left ankle bimalleolar     fracture. 2. Open reduction and internal fixation of left ankle syndesmosis.  SURGEON:  Toni ArthursJohn Nyomie Ehrlich, MD  ANESTHESIA:  MAC, regional.  ESTIMATED BLOOD LOSS:  Minimal.  TOURNIQUET TIME:  48 minutes at 250 mmHg.  COMPLICATIONS:  None apparent.  DISPOSITION:  Extubated awake and stable to recovery.  INDICATIONS FOR PROCEDURE:  The patient is a 80 year old male, who lives independently.  He fell at home on some ice injuring his left ankle.  He went to the emergency department where x-rays revealed a bimalleolar ankle fracture and disruption of the syndesmosis.  He presents now for operative treatment of this unstable displaced ankle fracture.  He understands the risks and benefits, the alternative treatment options, and elects surgical treatment.  He specifically understands risks of bleeding, infection, nerve damage, blood clots, need for additional surgery, amputation, and death.  PROCEDURE IN DETAIL:  After preoperative consent was obtained and the correct operative site was identified, the patient was brought to the operating room and placed supine on the operating table.  Regional anesthesia had previously been administered.  IV sedation was administered.  A surgical time-out was taken.  Preoperative antibiotics were administered.  The left lower extremity was prepped and draped in standard sterile fashion with  a tourniquet around the thigh.  The extremity was exsanguinated and the tourniquet was inflated to 250 mmHg. A longitudinal incision was then made over the lateral malleolus.  Sharp dissection was carried down through the skin and subcutaneous tissue. Fracture site was identified.  It was cleared of all hematoma.  This was a short oblique fracture proximal to the syndesmosis.  Fracture was reduced and held with a tenaculum.  An 8-hole composite fibular plate was selected from the [employer] ALPS fibular plate set.  The fracture was held in a reduced position and the plate was clamped over the fracture site.  The plate was secured proximally with three bicortical screws and distally with 2 bicortical screws.  An attempt was made at placing a lag screw through the plate and across the fracture site.  This was unsuccessful and was abandoned.  The most distal hole was left open for syndesmosis screw to be placed later.  Attention was then turned to the medial malleolus.  An oblique incision was made over the fracture site.  Sharp dissection was carried down through skin and subcutaneous tissue.  Fracture site was irrigated and cleaned of all hematoma and periosteum.  The fracture was reduced and held with a tenaculum.  AP and lateral radiographs confirmed appropriate reduction of the fracture site.  Two 4-mm partially-threaded cannulated  screws were then inserted over guide pins.  AP and lateral radiographs confirmed appropriate position of both screws and appropriate reduction of the fracture site.  At this point, a mortise view was obtained.  Dorsiflexion and external rotation stress was then applied showing widening of the syndesmosis and medial clear space.  A Weber tenaculum was used to hold the syndesmosis in a reduced position.  Most distal 2 holes of the plate were then used to insert syndesmosis screws from the plate across the fibula and across the distal tibia in a bicortical  fashion.  AP and lateral radiographs confirmed appropriate reduction of the syndesmosis and appropriate position and length of all hardware.  Medial and lateral malleolus fractures were also noted to be appropriately reduced.  Both wounds were irrigated copiously.  The deep subcutaneous tissue was approximated with inverted simple sutures of 3-0 Monocryl and a running 3-0 nylon was used to close the skin incision laterally.  The medial incision was closed in similar fashion.  Sterile dressings were applied followed by a well- padded short-leg splint.  Tourniquet was released at 48 minutes after application of the dressings.  The patient was awakened from anesthesia and transported to the recovery room in stable condition.  FOLLOWUP PLAN:  The patient will be admitted for inpatient care of his ankle fracture.  He will have physical therapy and occupational therapy. I believe it is likely that he will require admission to a skilled nursing facility for short-term rehab prior to being able to go home to live independently.  He will start Lovenox for DVT prophylaxis tomorrow and then transition to aspirin upon discharge.     Toni Arthurs, MD     JH/MEDQ  D:  06/06/2013  T:  06/07/2013  Job:  [id number]

## 2013-06-07 NOTE — Progress Notes (Addendum)
Clinical Social Work Department CLINICAL SOCIAL WORK PLACEMENT NOTE 06/07/2013  Patient:  Roy Barajas,Roy Barajas  Account Number:  [id number] Admit date:  06/06/2013  Clinical Social Worker:  Samuella BruinKRISTIN DRINKARD, Theresia MajorsLCSWA  Date/time:  06/07/2013 04:49 PM  Clinical Social Work is seeking post-discharge placement for this patient at the following level of care:   SKILLED NURSING   (*CSW will update this form in Epic as items are completed)   06/07/2013  Patient/family provided with [facility] of Clinical Social Work's list of facilities offering this level of care within the geographic area requested by the patient (or if unable, by the patient's family).  06/07/2013  Patient/family informed of their freedom to choose among providers that offer the needed level of care, that participate in Medicare, Medicaid or managed care program needed by the patient, have an available bed and are willing to accept the patient.    Patient/family informed of [facility]' ownership interest in [facility], as well as of the fact that they are under no obligation to receive care at this facility.  PASARR submitted to EDS on 06/07/2013 PASARR number received from EDS on 06/07/2013  FL2 transmitted to all facilities in geographic area requested by pt/family on  06/07/2013 FL2 transmitted to all facilities within larger geographic area on   Patient informed that his/her managed care company has contracts with or will negotiate with  certain facilities, including the following:     Patient/family informed of bed offers received:  06/08/2013 [facility]ician recommends and patient chooses bed at    Patient to be transferred to [facility] on  06/09/2013 [facility]  The following physician request were entered in Epic:   Additional Comments:  Samuella BruinKristin Drinkard, MSW, [facility]WA Clinical Social Worker [phone]

## 2013-06-07 NOTE — Evaluation (Signed)
Physical Therapy Evaluation Patient Details Name: Ricky Gallery MRN: [id number] DOB: Mar 20, 1921 Today's Date: 06/07/2013 Time: 1324-4010 PT Time Calculation (min): 24 min  PT Assessment / Plan / Recommendation History of Present Illness  Pt is a very active 80 y/o male who slipped and fell outside his home, sustaining a L ankle fracture. Pt is now s/p ORIF L ankle and is NWB.  Clinical Impression  This patient presents with acute pain and decreased functional independence following the above mentioned procedure. At the time of PT eval, pt very motivated to participate, and was able to ambulate a short distance while maintaining NWB well. This patient is appropriate for skilled PT interventions to address functional limitations, improve safety and independence with functional mobility, and return to PLOF.     PT Assessment  Patient needs continued PT services    Follow Up Recommendations  SNF;Supervision for mobility/OOB    Does the patient have the potential to tolerate intense rehabilitation      Barriers to Discharge        Equipment Recommendations  Other (comment) (TBD by next venue of care)    Recommendations for Other Services     Frequency Min 5X/week    Precautions / Restrictions Precautions Precautions: Fall Restrictions Weight Bearing Restrictions: Yes LLE Weight Bearing: Non weight bearing   Pertinent Vitals/Pain Pt reports minimal pain throughout session.       Mobility  Bed Mobility Overal bed mobility: Needs Assistance Bed Mobility: Supine to Sit Supine to sit: Supervision General bed mobility comments: Pt able to transition to EOB well with no physical assist. HOB slightly elevated.  Transfers Overall transfer level: Needs assistance Equipment used: Rolling walker (2 wheeled) Transfers: Sit to/from Stand Sit to Stand: Min assist;Min guard General transfer comment: VC's for hand placement on seated surface for safety.  Ambulation/Gait Ambulation/Gait  assistance: Min assist Ambulation Distance (Feet): 15 Feet Assistive device: Rolling walker (2 wheeled) Gait Pattern/deviations:  (Hop-to) Gait velocity: Decreased Gait velocity interpretation: Below normal speed for age/gender General Gait Details: VC's for sequencing and safety awareness with the RW. Occasional min assist for walker placement and steadying of pt.     Exercises General Exercises - Lower Extremity Ankle Circles/Pumps: 10 reps (toe wiggles on L) Quad Sets: 10 reps   PT Diagnosis: Difficulty walking;Acute pain  PT Problem List: Decreased strength;Decreased range of motion;Decreased activity tolerance;Decreased balance;Decreased mobility;Decreased knowledge of use of DME;Decreased safety awareness;Pain PT Treatment Interventions: DME instruction;Gait training;Stair training;Functional mobility training;Therapeutic activities;Therapeutic exercise;Neuromuscular re-education;Patient/family education     PT Goals(Current goals can be found in the care plan section) Acute Rehab PT Goals Patient Stated Goal: To return to living independently. PT Goal Formulation: With patient Time For Goal Achievement: 06/21/13 Potential to Achieve Goals: Good  Visit Information  Last PT Received On: 06/07/13 Assistance Needed: +2 (chair follow) History of Present Illness: Pt is a 80 y/o male who slipped and fell outside his home, sustaining a L ankle fracture. Pt is now s/p ORIF L ankle and is NWB.       Prior Functioning  Home Living Family/patient expects to be discharged to:: Skilled nursing facility Living Arrangements: Alone Prior Function Level of Independence: Independent Comments: Still driving, does minimal grocery shopping/cooking for himself - eats out a lot Communication Communication: No difficulties Dominant Hand: Right    Cognition  Cognition Arousal/Alertness: Awake/alert Behavior During Therapy: WFL for tasks assessed/performed Overall Cognitive Status: Within  Functional Limits for tasks assessed    Extremity/Trunk Assessment Upper Extremity Assessment  Upper Extremity Assessment: Overall WFL for tasks assessed Lower Extremity Assessment Lower Extremity Assessment: Overall WFL for tasks assessed;LLE deficits/detail LLE Deficits / Details: Decreased strength and AROM consistent with ankle fracture and ORIF. Unable to wiggle toes. LLE: Unable to fully assess due to immobilization LLE Sensation: decreased light touch Cervical / Trunk Assessment Cervical / Trunk Assessment: Normal   Balance Balance Overall balance assessment: Needs assistance Sitting-balance support: Feet supported;Bilateral upper extremity supported Sitting balance-Leahy Scale: Fair Standing balance support: Bilateral upper extremity supported Standing balance-Leahy Scale: Poor Standing balance comment: Unable to stand without BUE support and keep NWB on LLE.   End of Session PT - End of Session Equipment Utilized During Treatment: Gait belt Activity Tolerance: Patient tolerated treatment well Patient left: in chair;with call bell/phone within reach;with family/visitor present Nurse Communication: Mobility status  GP     Ruthann CancerHamilton, Bridgitt Raggio 06/07/2013, 2:33 PM  Ruthann CancerLaura Hamilton, PT, DPT Acute Rehabilitation Services Pager: [phone]

## 2013-06-07 NOTE — Progress Notes (Signed)
Clinical Social Work Department BRIEF PSYCHOSOCIAL ASSESSMENT 06/07/2013  Patient:  Roy Barajas,Roy Barajas     Account Number:  [id number]     Admit date:  06/06/2013  Clinical Social Worker:  Su Monks  Date/Time:  06/07/2013 04:21 PM  Referred by:  Physician  Date Referred:  06/06/2013 Referred for  SNF Placement   Other Referral:   Interview type:  Patient Other interview type:   CSW also spoke with son Roy Barajas [phone] who was present at the bedside.    PSYCHOSOCIAL DATA Living Status:  ALONE Admitted from facility:   Level of care:   Primary support name:  Roy Barajas [phone] Primary support relationship to patient:  CHILD, ADULT Degree of support available:   Strong but son lives in [place] Current Concerns  Post-Acute Placement   Other Concerns:    SOCIAL WORK ASSESSMENT / PLAN CSW met with patient and son to discuss SNF. CSW introduced self and explained CSW role, patient and son agreeable to speaking. CSW explained PT recommendation of SNF for short term rehab. Patient and son agreeable to SNF, state preference is for [facility]. Patient has no other family in the area, son lives in [place]. Son is primary support. According to patient and son, patient was independent before fall. CSW explained process of SNF search, patient agreeable.   Assessment/plan status:  Information/Referral to [employer] Other assessment/ plan:   Information/referral to community resources:   CSW provided patient and son with local SNF listing and will initiate SNF search in [place].    PATIENT'S/FAMILY'S RESPONSE TO PLAN OF CARE: Patient and son feel strongly that patient will need SNF rehab to recooperate. They prefer [facility]. Patient expressed difficulty of losing independence and not being able to temporarily engage in activities of interest such as spending time with his friends and walking. Son believes that patient  may beed ALF after SNF rehab- patient seems to understand that he may need a higher level of care. CSW offered emotional support.    Roy Barajas, MSW, [place] Social Worker [phone]

## 2013-06-08 ENCOUNTER — Encounter (HOSPITAL_COMMUNITY): Payer: Self-pay | Admitting: Orthopedic Surgery

## 2013-06-08 ENCOUNTER — Inpatient Hospital Stay (HOSPITAL_COMMUNITY): Payer: Medicare Other

## 2013-06-08 MED ORDER — OXYCODONE HCL 5 MG PO TABS: 5.0000 mg | ORAL_TABLET | ORAL | Status: DC | PRN

## 2013-06-08 NOTE — Progress Notes (Signed)
Physical Therapy Treatment Patient Details Name: Roy Barajas MRN: [id number] DOB: Feb 16, 1921 Today's Date: 06/08/2013 Time: 4098-1191 PT Time Calculation (min): 23 min  PT Assessment / Plan / Recommendation  History of Present Illness Pt is a 80 y/o male who slipped and fell outside his home, sustaining a L ankle fracture. Pt is now s/p ORIF L ankle and is NWB.   PT Comments   Pt progressing towards physical therapy goals. Able to improve gait distance today, with walker height adjusted for optimal UE leverage to maintain NWB status. Pt tolerated therapeutic exercise well with no complaints of increased pain.   Follow Up Recommendations  SNF;Supervision for mobility/OOB     Does the patient have the potential to tolerate intense rehabilitation     Barriers to Discharge        Equipment Recommendations  Rolling walker with 5" wheels;3in1 (PT)    Recommendations for Other Services    Frequency Min 5X/week   Progress towards PT Goals Progress towards PT goals: Progressing toward goals  Plan Current plan remains appropriate    Precautions / Restrictions Precautions Precautions: Fall Restrictions Weight Bearing Restrictions: Yes LLE Weight Bearing: Non weight bearing   Pertinent Vitals/Pain Pt on RA throughout session and mildly SOB after ambulation, with noted wheezing throughout session even when on supplemental O2.     Mobility  Bed Mobility Overal bed mobility: Needs Assistance Bed Mobility: Supine to Sit Supine to sit: Supervision General bed mobility comments: Pt able to transition to EOB well with no physical assist. HOB slightly elevated.  Transfers Overall transfer level: Needs assistance Equipment used: Rolling walker (2 wheeled) Transfers: Sit to/from Stand Sit to Stand: Min guard;Supervision General transfer comment: Min guard initially progressing to supervision with improved hand placement on seated surface.  Ambulation/Gait Ambulation/Gait assistance: Min  guard Ambulation Distance (Feet): 40 Feet Assistive device: Rolling walker (2 wheeled) Gait Pattern/deviations:  (Hop-to) Gait velocity: Decreased Gait velocity interpretation: Below normal speed for age/gender General Gait Details: VC's for sequencing and safety awareness with the RW. Occasional min assist for walker placement and steadying of pt.     Exercises General Exercises - Lower Extremity Ankle Circles/Pumps: 10 reps (toe wiggles on L) Quad Sets: 10 reps Long Arc Quad: 10 reps Hip ABduction/ADduction: 10 reps Straight Leg Raises: 10 reps   PT Diagnosis:    PT Problem List:   PT Treatment Interventions:     PT Goals (current goals can now be found in the care plan section) Acute Rehab PT Goals Patient Stated Goal: To return to living independently. PT Goal Formulation: With patient Time For Goal Achievement: 06/21/13 Potential to Achieve Goals: Good  Visit Information  Last PT Received On: 06/08/13 Assistance Needed: +2 (chair follow) History of Present Illness: Pt is a 80 y/o male who slipped and fell outside his home, sustaining a L ankle fracture. Pt is now s/p ORIF L ankle and is NWB.    Subjective Data  Subjective: "When will I get to go to rehab?" Patient Stated Goal: To return to living independently.   Cognition  Cognition Arousal/Alertness: Awake/alert Behavior During Therapy: WFL for tasks assessed/performed Overall Cognitive Status: Within Functional Limits for tasks assessed    Balance  Balance Overall balance assessment: Needs assistance Sitting-balance support: Feet supported Sitting balance-Leahy Scale: Good Standing balance support: Bilateral upper extremity supported Standing balance-Leahy Scale: Poor Standing balance comment: Unable to stand without BUE support and keep NWB on LLE.   End of Session PT - End of Session  Equipment Utilized During Treatment: Gait belt Activity Tolerance: Patient tolerated treatment well Patient left: in  chair;with call bell/phone within reach;with family/visitor present Nurse Communication: Mobility status   GP     Ruthann CancerHamilton, Katerin Negrete 06/08/2013, 1:48 PM  Ruthann CancerLaura Hamilton, PT, DPT Acute Rehabilitation Services Pager: [phone]

## 2013-06-08 NOTE — Progress Notes (Signed)
Subjective: 2 Days Post-Op Procedure(s) (LRB): OPEN REDUCTION INTERNAL FIXATION (ORIF) LEFT BIMALLEOLAR ANKLE FRACTURE AND LEFT ANKLE SYNDESMOSIS DISRUPTION (Left) Patient reports pain as mild.  SNF placement pending.  Objective: Vital signs in last 24 hours: Temp:  [98.2 F (36.8 C)-98.7 F (37.1 C)] 98.7 F (37.1 C) (03/05 0553) Pulse Rate:  [65-70] 70 (03/05 0553) Resp:  [18] 18 (03/05 0553) BP: (142-154)/(58-65) 142/65 mmHg (03/05 0553) SpO2:  [96 %] 96 % (03/05 0553)  Intake/Output from previous day: 03/04 0701 - 03/05 0700 In: 1340 [P.O.:840; I.V.:500] Out: 700 [Urine:700] Intake/Output this shift: Total I/O In: 240 [P.O.:240] Out: 200 [Urine:200]   Recent Labs  06/06/13 2052  HGB 11.6*    Recent Labs  06/06/13 2052  WBC 2.0*  RBC 3.63*  HCT 33.4*  PLT 179    Recent Labs  06/06/13 2052  CREATININE 2.10*   No results found for this basename: LABPT, INR,  in the last 72 hours  PE:  LE splinted.  NVI at toes.  Assessment/Plan: 2 Days Post-Op Procedure(s) (LRB): OPEN REDUCTION INTERNAL FIXATION (ORIF) LEFT BIMALLEOLAR ANKLE FRACTURE AND LEFT ANKLE SYNDESMOSIS DISRUPTION (Left)  Continue NWB on operated leg.  PLan d/c to snf tomorrow.  Spoke with Amy (SW) who was communicating with [facility].  Pt and so understand this plan and agree.  FL2 signed on chart.  D/c summary done.  Orders done.  Toni ArthursHEWITT, Freddy Spadafora 06/08/2013, 2:22 PM

## 2013-06-08 NOTE — Progress Notes (Signed)
Increased in expiratory wheezing throughout the night. Patient on 2L 02 [place] sats 100%. On auscultation lungs rhonci with expiratory wheezing. Pt encouraged to deep breath cough and use incentive spirometer. Cough sounds congested and wet. Patient has no complaints of his respiratory status and denies any sob or difficulty breathing. With exertion patient appear very SOB with increased wheezing. PA on call notified and orders given for CXR. Will continue to monitor.

## 2013-06-08 NOTE — Progress Notes (Signed)
OT Cancellation Note  Patient Details Name: Roy Barajas MRN: [id number] DOB: [birth year]   Cancelled Treatment:      Pt is [employer] Medicare and current D/C plan is SNF. No apparent immediate acute care OT needs, therefore will defer OT to SNF. If OT eval is needed please call Acute Rehab Dept. at [phone] or text page OT at [phone].   Rae LipsMiller, Masiyah Jorstad M [phone] 06/08/2013, 2:00 PM

## 2013-06-08 NOTE — Discharge Summary (Signed)
Physician Discharge Summary  Patient ID: Roy Barajas MRN: [id number] DOB/AGE: 04/20/1920 80 y.o.  Admit date: 06/06/2013 Discharge date: 06/08/2013  Admission Diagnoses: Left bimalleolar ankle fracture with syndesmosis disruption  Discharge Diagnoses: same Active Problems:   Bimalleolar fracture of left ankle   Discharged Condition: good  Hospital Course: On 06/06/2013 pt was brought to OR for surgical correction of left bimalleolar fracture with syndesmosis disruption.  Pt slipped and fell on ice on 05/28/2013 causing injury and prompting surgical intervention.  All risks and benefits of surgery were explained to pt and he elected to proceed.  On 06/06/2013 pt underwent ORIF of left bimal fracture and syndesmosis disruption.  Procedure was performed by Dr. Toni Arthurs and was without complication.  After surgery pt was recovered in PACU and then transferred to the floor for further post operative care.  While admitted pt was given Lovenox 40mg  subq per day for anticoagulation as well as PT/OT/social work for proper rehab and placement post discharge.  On 06/09/2013 all VS were stable and pt was appropriate for discharge.  Prognosis for the pt is good and he will f/u with Dr. Victorino Dike in 2 weeks.   Consults: None  Significant Diagnostic Studies: none  Treatments: surgery: as stated above  Discharge Exam: Blood pressure 142/65, pulse 70, temperature 98.7 F (37.1 C), temperature source Oral, resp. rate 18, SpO2 96.00%. WD WN elderly male in NAD, A/Ox3, appears stated age.  EOMI, mood and affect normal, respirations unlabored. On physical exam splint is c/d/i, in proper placement and in good repair. Toes mobile and well perfused with normal sensation to light touch.   Disposition: Final discharge disposition not confirmed  Discharge Orders   Future Orders Complete By Expires   Call MD / Call 911  As directed    Comments:     If you experience chest pain or shortness of breath, CALL 911 and be  transported to the hospital emergency room.  If you develope a fever above 101 F, pus (white drainage) or increased drainage or redness at the wound, or calf pain, call your surgeon's office.   Constipation Prevention  As directed    Comments:     Drink plenty of fluids.  Prune juice may be helpful.  You may use a stool softener, such as Colace (over the counter) 100 mg twice a day.  Use MiraLax (over the counter) for constipation as needed.   Diet - low sodium heart healthy  As directed    Driving restrictions  As directed    Comments:     No driving for 6 weeks   Increase activity slowly as tolerated  As directed    Lifting restrictions  As directed    Comments:     No lifting for 6 weeks       Medication List         acetaminophen 500 MG tablet  Commonly known as:  TYLENOL  Take 1,000 mg by mouth every 6 (six) hours as needed.     finasteride 5 MG tablet  Commonly known as:  PROSCAR  Take 5 mg by mouth daily.     multivitamin with minerals tablet  Take 1 tablet by mouth daily.     omeprazole 20 MG capsule  Commonly known as:  PRILOSEC  Take 20 mg by mouth 2 (two) times daily before a meal.     oxyCODONE 5 MG immediate release tablet  Commonly known as:  Oxy IR/ROXICODONE  Take 1-2 tablets (5-10 mg  total) by mouth every 4 (four) hours as needed for moderate pain or breakthrough pain.     propranolol 20 MG tablet  Commonly known as:  INDERAL  Take 20 mg by mouth 1 day or 1 dose.     silodosin 8 MG Caps capsule  Commonly known as:  RAPAFLO  Take 8 mg by mouth daily with breakfast.     tamsulosin 0.4 MG Caps capsule  Commonly known as:  FLOMAX  Take 0.4 mg by mouth 2 (two) times daily.     triamterene-hydrochlorothiazide 37.5-25 MG per tablet  Commonly known as:  MAXZIDE-25  Take 1 tablet by mouth daily.           Follow-up Information   Follow up with HEWITT, Jonny RuizJOHN, MD. Schedule an appointment as soon as possible for a visit in 2 weeks.   Specialty:   Orthopedic Surgery   Contact information:   [address] [place] [address] [phone]       Follow up with Toni ArthursHEWITT, JOHN, MD. Schedule an appointment as soon as possible for a visit in 2 weeks. (For suture removal)    Specialty:  Orthopedic Surgery   Contact information:   [address] [place] [address] [phone]       Signed: [place] PoliFLOWERS, CHRISTOPHER S 06/08/2013, 2:20 PM

## 2013-06-09 NOTE — Progress Notes (Signed)
Subjective: 3 Days Post-Op Procedure(s) (LRB): OPEN REDUCTION INTERNAL FIXATION (ORIF) LEFT BIMALLEOLAR ANKLE FRACTURE AND LEFT ANKLE SYNDESMOSIS DISRUPTION (Left) Patient doing well this AM, c/o mild SOB.  Pain well controlled 1/10 during exam, progressing well with physical therapy.  Pt denies N/V/F/C, chest pain, calf pain or paresthesia b/l.   Objective: Vital signs in last 24 hours: Temp:  [98 F (36.7 C)-98.6 F (37 C)] 98.6 F (37 C) (03/06 0612) Pulse Rate:  [54-65] 65 (03/06 0612) Resp:  [18] 18 (03/06 0612) BP: (104-120)/(60-74) 120/74 mmHg (03/06 0612) SpO2:  [96 %-100 %] 99 % (03/06 0612)  Intake/Output from previous day: 03/05 0701 - 03/06 0700 In: 960 [P.O.:960] Out: 400 [Urine:400] Intake/Output this shift:     Recent Labs  06/06/13 2052  HGB 11.6*    Recent Labs  06/06/13 2052  WBC 2.0*  RBC 3.63*  HCT 33.4*  PLT 179    Recent Labs  06/06/13 2052  CREATININE 2.10*   No results found for this basename: LABPT, INR,  in the last 72 hours  WD WN elderly 80y/o male in NAD, A/Ox3, appears stated age. EOMI, mood and affect normal, respirations unlabored.  On physical exam splint is c/d/i, in proper placement and in good repair.  Toes mobile and well perfused with cap refill <2 sec and normal sensation to light.   Assessment/Plan: 3 Days Post-Op Procedure(s) (LRB): OPEN REDUCTION INTERNAL FIXATION (ORIF) LEFT BIMALLEOLAR ANKLE FRACTURE AND LEFT ANKLE SYNDESMOSIS DISRUPTION (Left) Discharge to SNF Continue Aspirin 325mg  for anticoagulation Continue physical/occupational therapy for rehabilitation  F/u with Dr. Victorino DikeHewitt in 2 weeks  FLOWERS, CHRISTOPHER S 06/09/2013, 7:29 AM

## 2013-06-09 NOTE — Progress Notes (Signed)
Physical Therapy Treatment Patient Details Name: Roy Barajas MRN: [id number] DOB: 1920-06-25 Today's Date: 06/09/2013 Time: 4098-1191 PT Time Calculation (min): 19 min  PT Assessment / Plan / Recommendation  History of Present Illness Pt is a 80 y/o male who slipped and fell outside his home, sustaining a L ankle fracture. Pt is now s/p ORIF L ankle and is NWB.   PT Comments   Pt to d/c to SNF this afternoon following PT session. Was able to demonstrate improved gait technique and anticipate will progress further with tennis shoe (family to bring to rehab). States he is fatigued today, and was somewhat SOB throughout session, although wheezing appears to have improved from previous PT treatment. Overall good rehab effort.   Follow Up Recommendations  SNF;Supervision for mobility/OOB     Does the patient have the potential to tolerate intense rehabilitation     Barriers to Discharge        Equipment Recommendations  Rolling walker with 5" wheels;3in1 (PT)    Recommendations for Other Services    Frequency Min 5X/week   Progress towards PT Goals Progress towards PT goals: Progressing toward goals  Plan Current plan remains appropriate    Precautions / Restrictions Precautions Precautions: Fall Restrictions Weight Bearing Restrictions: Yes LLE Weight Bearing: Non weight bearing   Pertinent Vitals/Pain Pt reports no pain throughout session.     Mobility  Bed Mobility Overal bed mobility: Needs Assistance Bed Mobility: Sit to Supine Sit to supine: Supervision General bed mobility comments: Pt able to transition from EOB well with no physical assist.   Transfers Overall transfer level: Needs assistance Equipment used: Rolling walker (2 wheeled) Transfers: Sit to/from Stand Sit to Stand: Min guard;Supervision General transfer comment: Min guard initially progressing to supervision with improved hand placement on seated surface.  Ambulation/Gait Ambulation/Gait assistance: Min  guard Ambulation Distance (Feet): 40 Feet Assistive device: Rolling walker (2 wheeled) Gait Pattern/deviations:  (Hop-to) Gait velocity: Decreased Gait velocity interpretation: Below normal speed for age/gender General Gait Details: VC's for sequencing and safety awareness with the RW. Occasional min assist for walker placement and steadying of pt.     Exercises General Exercises - Lower Extremity Hip ABduction/ADduction: 10 reps Straight Leg Raises: 10 reps   PT Diagnosis:    PT Problem List:   PT Treatment Interventions:     PT Goals (current goals can now be found in the care plan section) Acute Rehab PT Goals Patient Stated Goal: To return to living independently. PT Goal Formulation: With patient Time For Goal Achievement: 06/21/13 Potential to Achieve Goals: Good  Visit Information  Last PT Received On: 06/09/13 Assistance Needed: +2 (chair follow) History of Present Illness: Pt is a 80 y/o male who slipped and fell outside his home, sustaining a L ankle fracture. Pt is now s/p ORIF L ankle and is NWB.    Subjective Data  Subjective: "I'm supposed to be leaving today." Patient Stated Goal: To return to living independently.   Cognition  Cognition Arousal/Alertness: Awake/alert Behavior During Therapy: WFL for tasks assessed/performed Overall Cognitive Status: Within Functional Limits for tasks assessed    Balance  Balance Overall balance assessment: Needs assistance Sitting-balance support: Feet supported Sitting balance-Leahy Scale: Good Standing balance support: Bilateral upper extremity supported Standing balance-Leahy Scale: Poor Standing balance comment: Unable to stand without BUE support and keep NWB on LLE.  End of Session PT - End of Session Equipment Utilized During Treatment: Gait belt Activity Tolerance: Patient tolerated treatment well Patient left: in chair;with  call bell/phone within reach;with family/visitor present Nurse Communication: Mobility  status   GP     Ruthann CancerHamilton, Wahid Holley 06/09/2013, 4:56 PM  Ruthann CancerLaura Hamilton, PT, DPT Acute Rehabilitation Services Pager: [phone]

## 2013-06-09 NOTE — Progress Notes (Signed)
Patient transport plan changed. Pt will be taken via non-emergent transport to [facility].

## 2013-06-09 NOTE — Progress Notes (Signed)
CSW (Clinical Social Worker) prepared pt dc packet and placed with shadow chart. CSW arranged non-emergent ambulance transport. Pt, pt family, pt nurse, and facility informed. CSW signing off.  Natali Lavallee, LCSWA [phone]  

## 2013-06-09 NOTE — Progress Notes (Signed)
Patient being discharged today [facility] private vehicle with his son.

## 2015-08-27 IMAGING — CR DG CHEST 2V
2 series · 2 of 2 positions shown · non-contrast
Comparison: 03/23/2010

CLINICAL DATA: Pre admit for left foot/ankle fracture repair

EXAM:
CHEST  2 VIEW

[w chest lat]
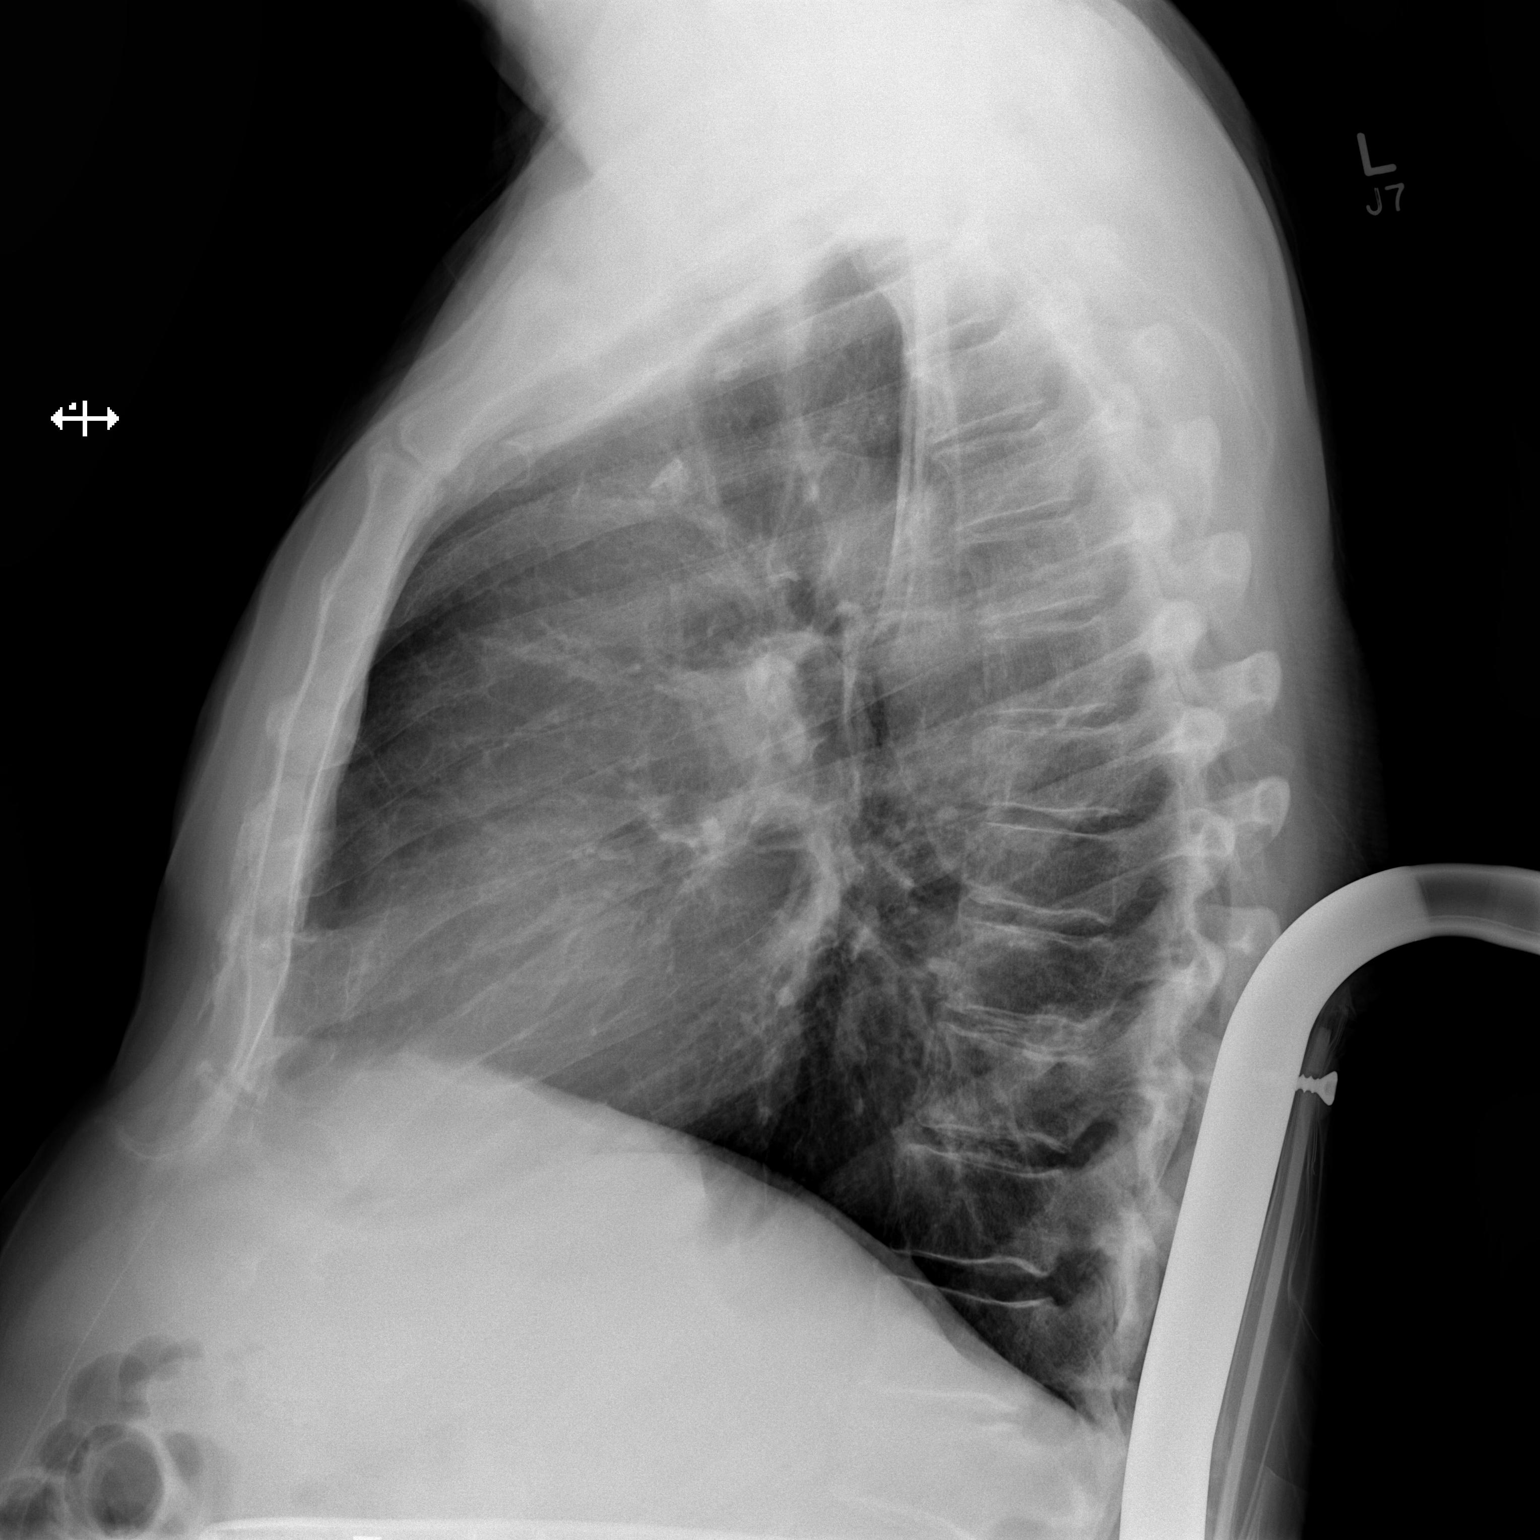

[x chest ap]
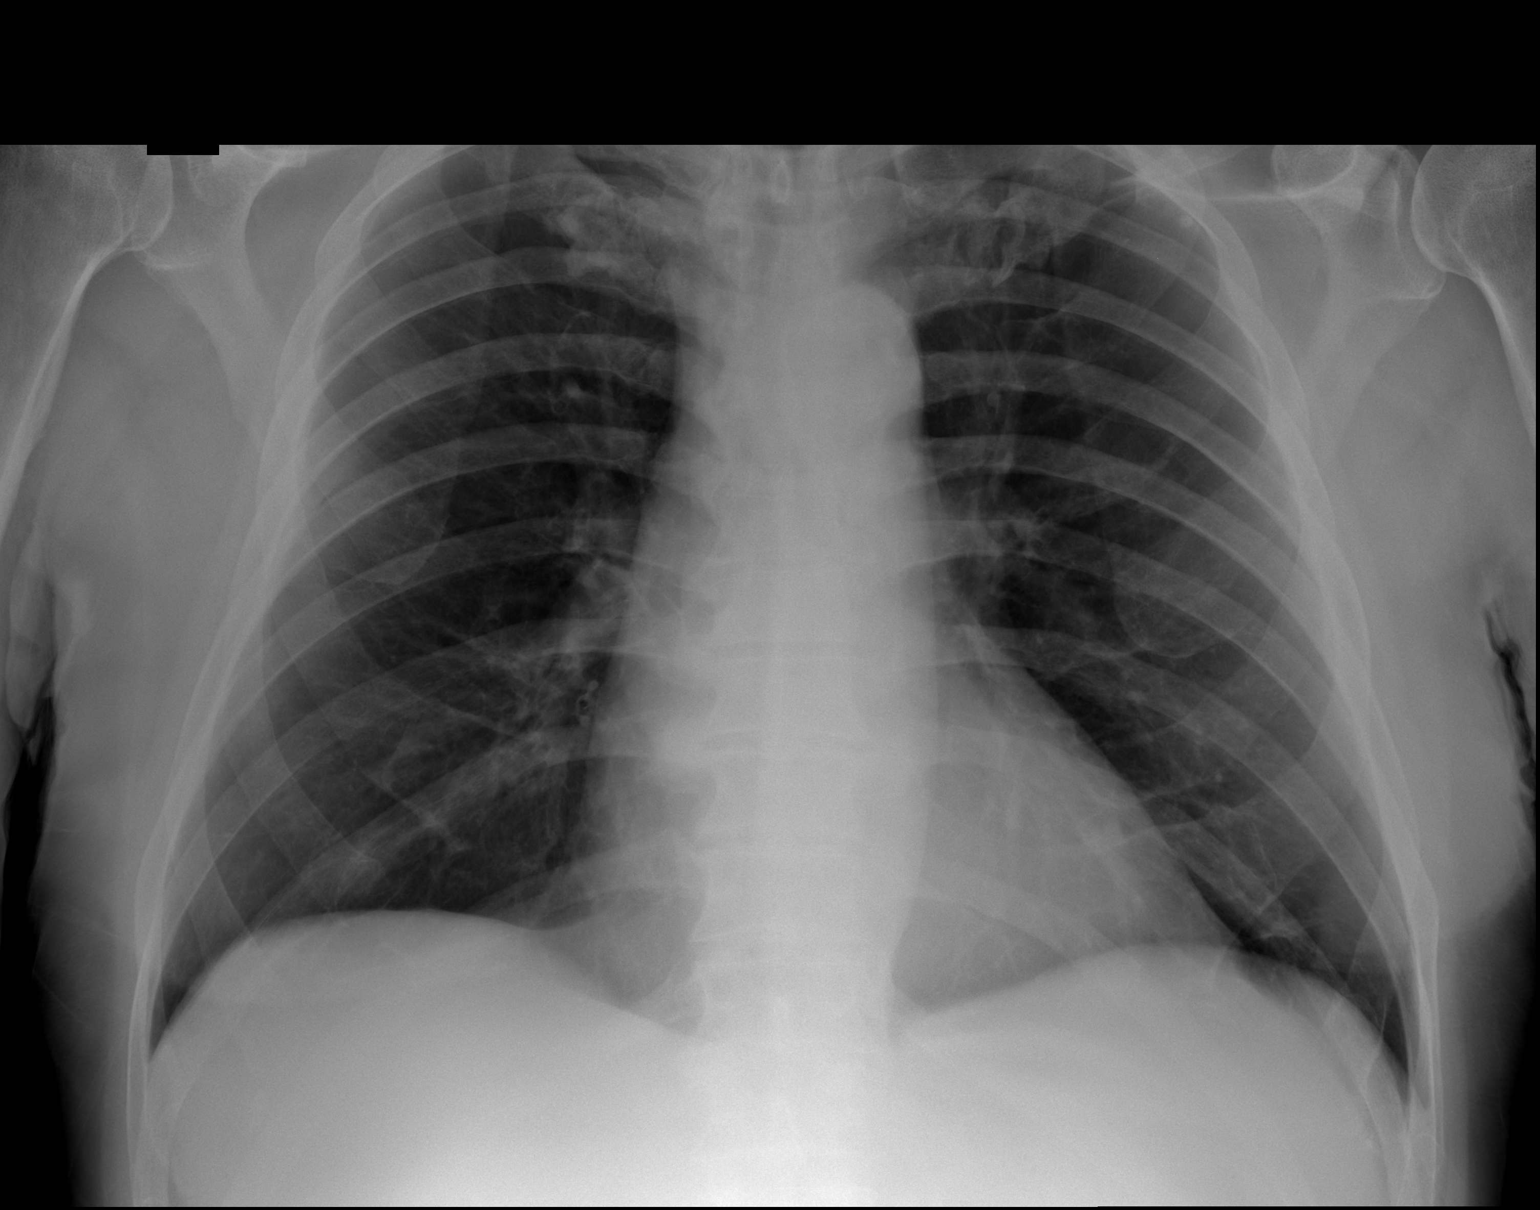

[2 of 2 positions shown; findings below may reference images not displayed]

FINDINGS: Lungs are clear.  No pleural effusion or pneumothorax.

The heart is normal in size.

Mild degenerative changes of the visualized thoracolumbar spine.
IMPRESSION: No evidence of acute cardiopulmonary disease.

## 2015-08-28 IMAGING — RF DG C-ARM 61-120 MIN
1 series · 5 of 5 positions shown · non-contrast
Comparison: 05/29/2013.

CLINICAL DATA: Ankle fracture fixation.

EXAM:
DG C-ARM 1-60 MIN; LEFT ANKLE - 2 VIEW

[Series 1: run · 5 of 5 slices shown]
[im 1/5]
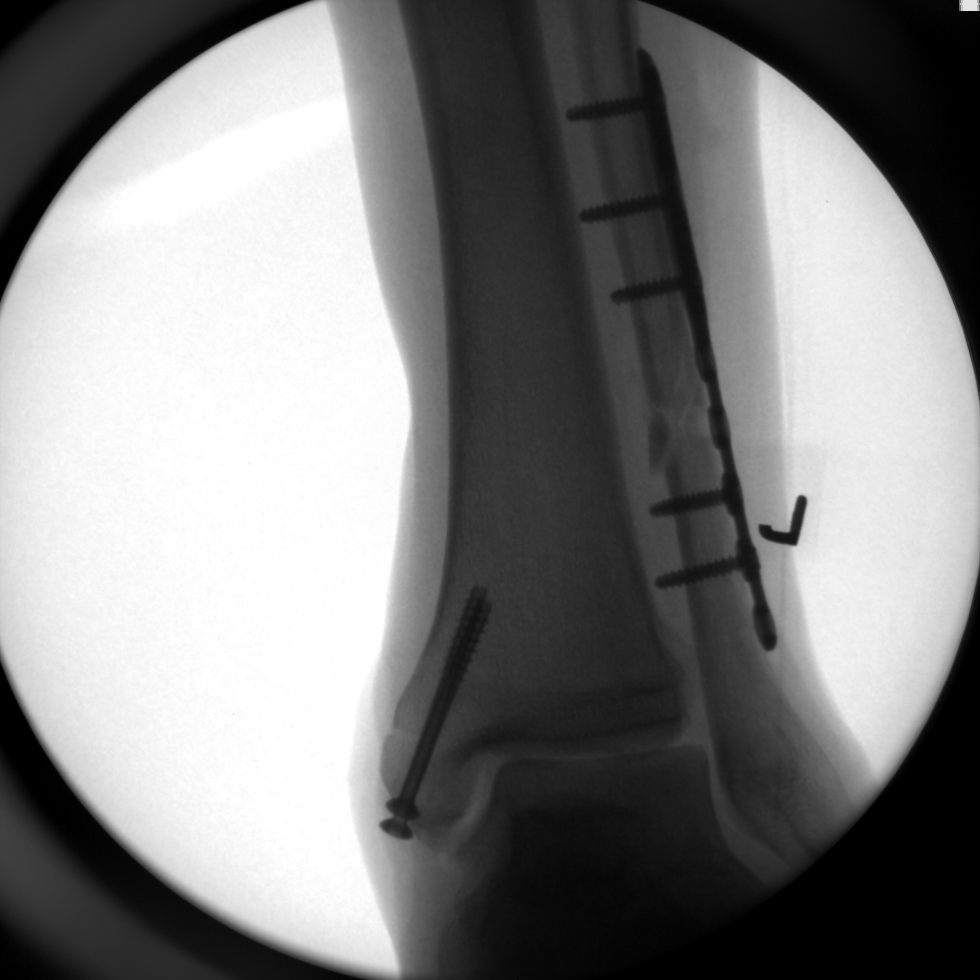
[im 2/5]
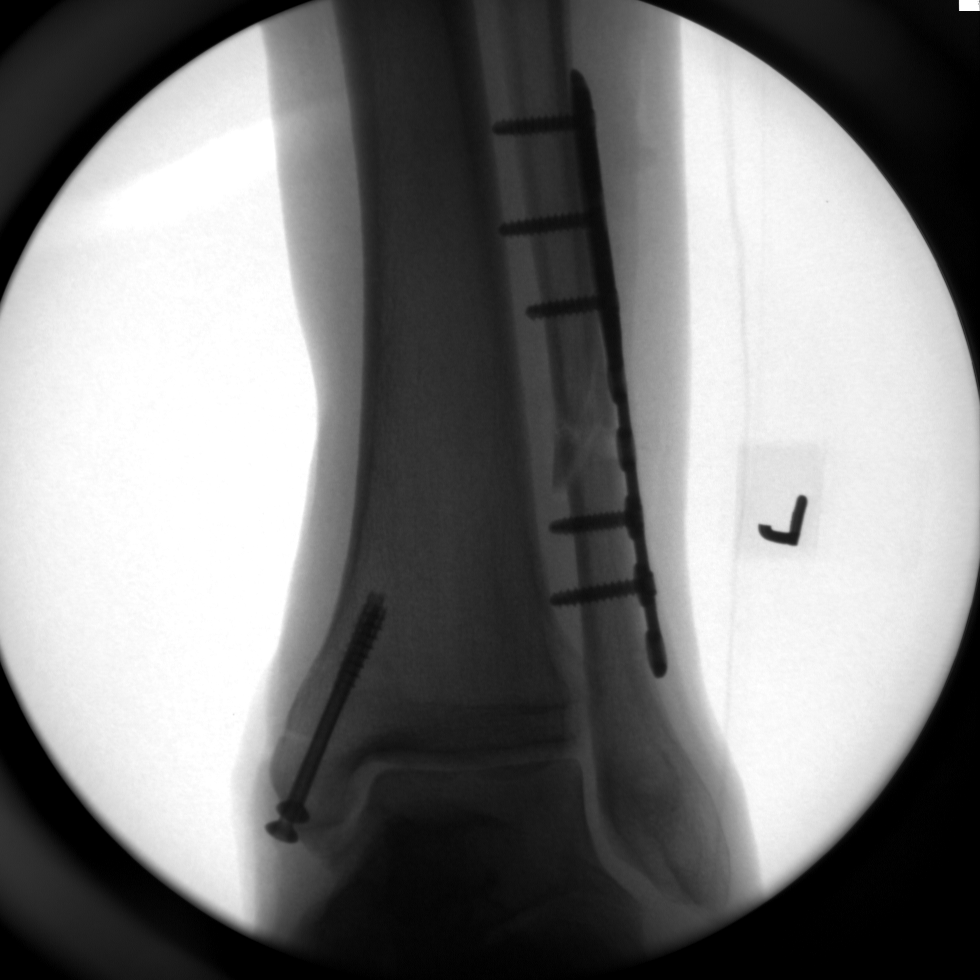
[im 3/5]
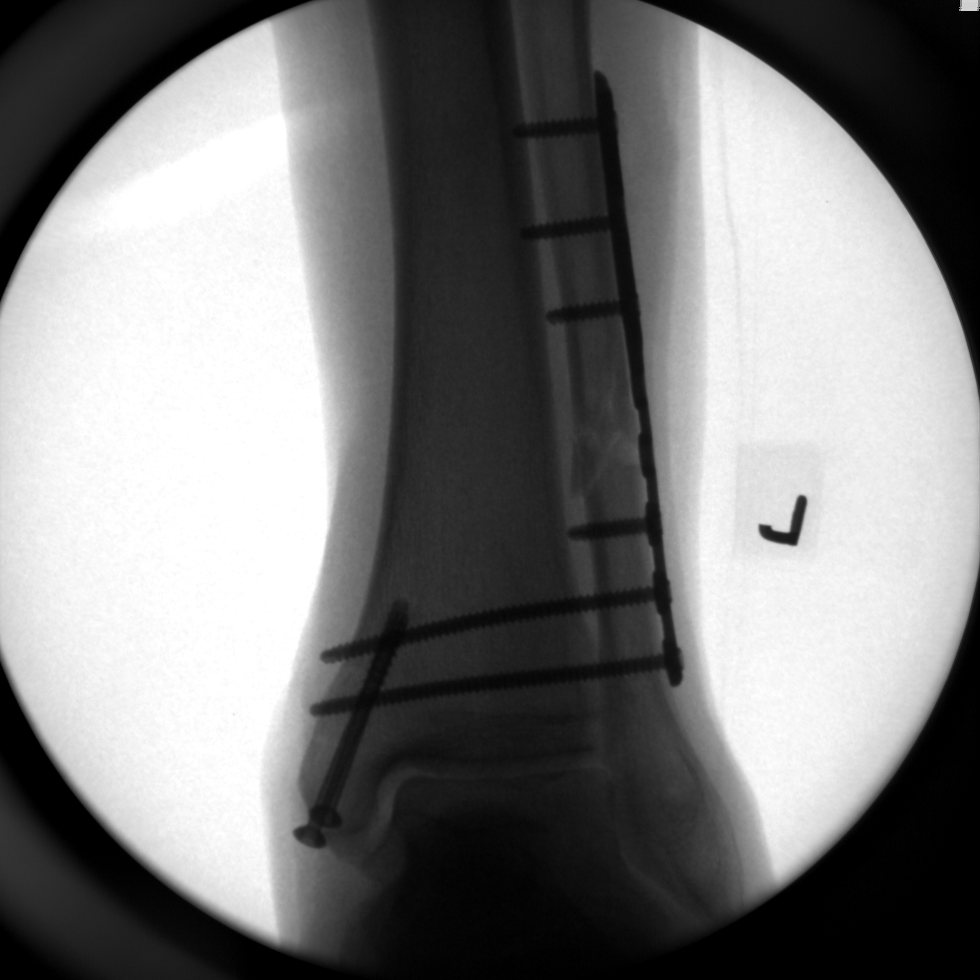
[im 4/5]
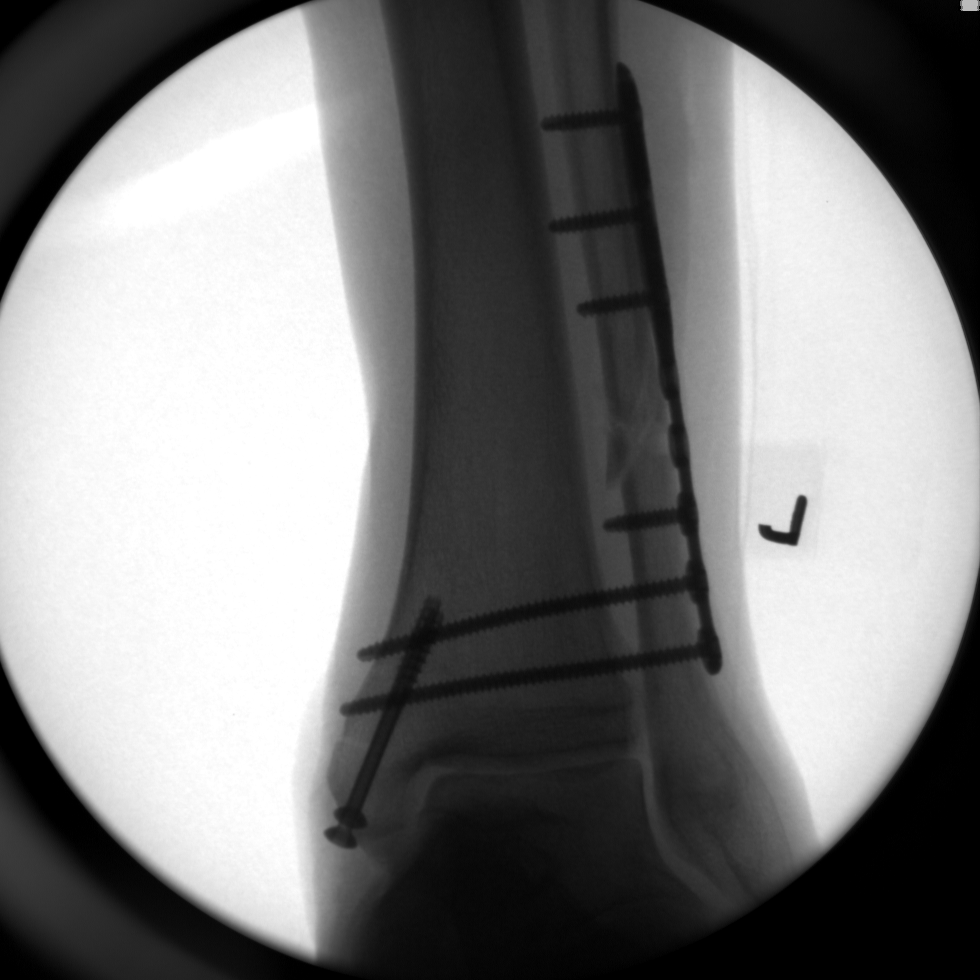
[im 5/5]
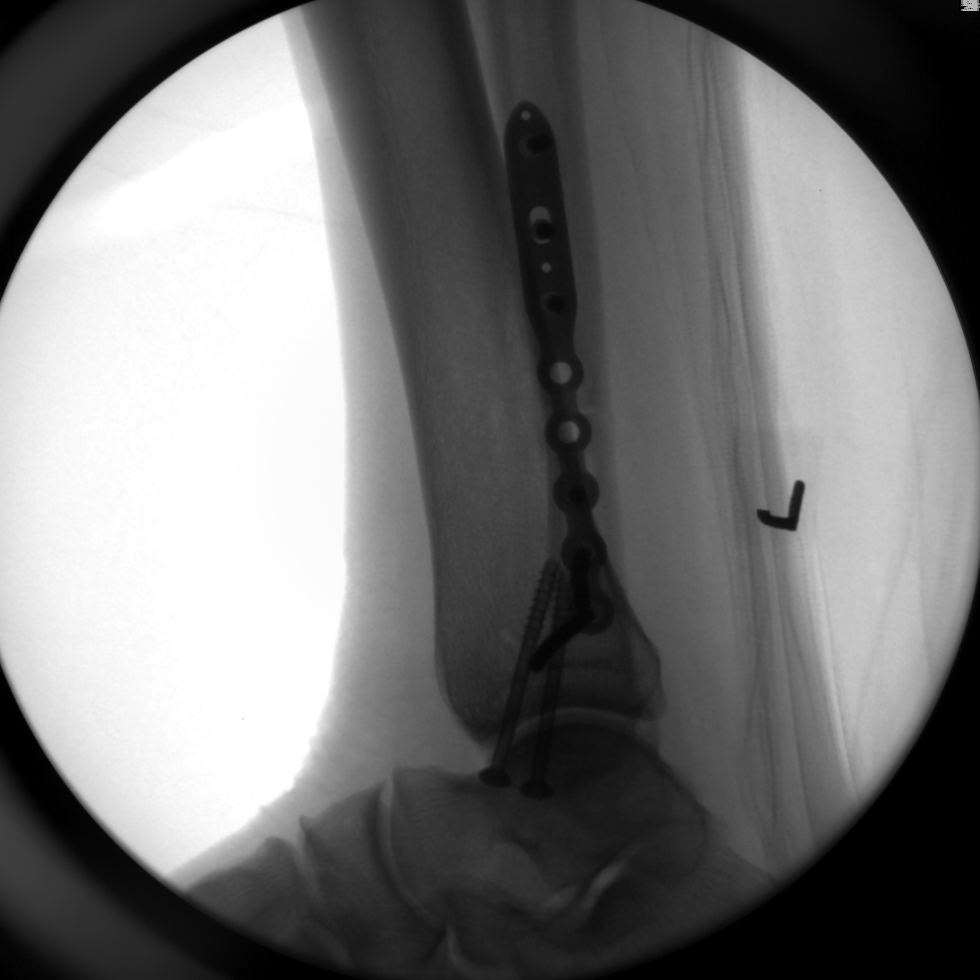

[5 of 5 positions shown; findings below may reference images not displayed]

FINDINGS: Multiple fluoroscopic spot images demonstrate placement of a long
plate and multiple screws transfixing the fibular fracture. There
also to cannulated malleolar screws transfixing the medial malleolus
fracture. Two long interosseous screws are also noted. The ankle
mortise is normal. No complicating features.
IMPRESSION: Open reduction and internal fixation of ankle fractures with
anatomic alignment and no complicating features.

## 2018-06-05 DEATH — deceased
